# Patient Record
Sex: Female | Born: 1990 | Race: White | Hispanic: No | Marital: Married | Smoking: Never smoker
Health system: Southern US, Community
[De-identification: ages and names within clinical notes are randomized; demographics above are authoritative.]

## PROBLEM LIST (undated history)

## (undated) DIAGNOSIS — F32A Depression, unspecified: Secondary | ICD-10-CM

## (undated) DIAGNOSIS — F419 Anxiety disorder, unspecified: Secondary | ICD-10-CM

## (undated) DIAGNOSIS — R87629 Unspecified abnormal cytological findings in specimens from vagina: Secondary | ICD-10-CM

## (undated) DIAGNOSIS — T7840XA Allergy, unspecified, initial encounter: Secondary | ICD-10-CM

## (undated) HISTORY — PX: TONSILLECTOMY AND ADENOIDECTOMY: SUR1326

## (undated) HISTORY — DX: Allergy, unspecified, initial encounter: T78.40XA

## (undated) HISTORY — PX: WISDOM TOOTH EXTRACTION: SHX21

## (undated) HISTORY — DX: Anxiety disorder, unspecified: F41.9

## (undated) HISTORY — DX: Depression, unspecified: F32.A

## (undated) HISTORY — DX: Unspecified abnormal cytological findings in specimens from vagina: R87.629

---
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
## 2011-03-23 ENCOUNTER — Ambulatory Visit: Payer: Self-pay | Admitting: Family Medicine

## 2011-03-25 ENCOUNTER — Encounter: Payer: Self-pay | Admitting: Family Medicine

## 2011-03-25 ENCOUNTER — Ambulatory Visit (INDEPENDENT_AMBULATORY_CARE_PROVIDER_SITE_OTHER): Payer: BC Managed Care – PPO | Admitting: Family Medicine

## 2011-03-25 VITALS — BP 110/80 | HR 64 | Temp 98.4°F | Ht 67.0 in | Wt 191.0 lb

## 2011-03-25 DIAGNOSIS — Z0000 Encounter for general adult medical examination without abnormal findings: Secondary | ICD-10-CM

## 2011-03-25 DIAGNOSIS — E663 Overweight: Secondary | ICD-10-CM

## 2011-03-25 NOTE — Patient Instructions (Addendum)
Return fasting at your convenience for blood work to check on things. Return as needed or in 2 years for next physical. Good to meet you today!  Call us with questions.

## 2011-03-25 NOTE — Assessment & Plan Note (Addendum)
Healthy 20 yo.  UTD tetanus and guardasil done in past.  Preventative protocols reviewed.  Discussed healthy living. Return fasting for blood work. Return in 2 years for next CPE

## 2011-03-25 NOTE — Assessment & Plan Note (Signed)
BMI 29. Discussed watching diet and increasing exercise as foundation for weight loss. Recommended start incorporating activity into routine. 4 recent steroid courses for sinusitis possibly contributing to recent weight gain.  hopeful not to need any more steroids.

## 2011-03-25 NOTE — Progress Notes (Signed)
  Subjective:    Patient ID: Amy Harvey, female    DOB: [birth year], 20 y.o.   MRN: [id number]  HPI CC: new patient, establish  Sick since December with sinus infection, went through 4 rounds of antibiotics as well as steroids.  T[facility], then went to [place] [facility] and then ENT doctor in [place].  Finished antibiotics and steroids 1 mo ago.  Now feeling better.  Recurrent sinus infections - told allergic to dust mites and grass, and less so to [facility] and white oak.  Noticing since January, gaining lots of weight, wonders if because of steroids.  No change in diet, still exercising same amt.  Has gained 30 lbs.  Activity - has gym at school, plays soccer, frisbee.  Has pool.  As far as diet, doesn't eat much red meat.  Does eat chicken and fruit.  + salads and green beans.  Prepares more than eats out.  Preventative: Tetanus 2010. Last CPE unsure, no recent blood work.  Not fasting today. Well woman exam, none recently.  Previously prescribed birth control by women's physicians in [place] Dr. Vickey Sages.  Scheduled to return there for well woman.  Medications and allergies reviewed and updated as above. Pmhx, Shx, FamHx reviewed and updated in chart.  Review of Systems  Constitutional: Negative for fever, chills, activity change, appetite change, fatigue and unexpected weight change.  HENT: Negative for hearing loss and neck pain.   Eyes: Negative for visual disturbance.  Respiratory: Negative for cough, chest tightness, shortness of breath and wheezing.   Cardiovascular: Negative for chest pain, palpitations and leg swelling.  Gastrointestinal: Negative for nausea, vomiting, abdominal pain, diarrhea, constipation, blood in stool and abdominal distention.  Genitourinary: Negative for hematuria and difficulty urinating.  Musculoskeletal: Negative for myalgias and arthralgias.  Skin: Negative for rash.  Neurological: Negative for dizziness, seizures, syncope and headaches.    Hematological: Does not bruise/bleed easily.  Psychiatric/Behavioral: Negative for dysphoric mood. The patient is not nervous/anxious.       Objective:   Physical Exam  Nursing note and vitals reviewed. Constitutional: She is oriented to person, place, and time. She appears well-developed and well-nourished. No distress.  HENT:  Head: Normocephalic and atraumatic.  Right Ear: External ear normal.  Left Ear: External ear normal.  Nose: Nose normal.  Mouth/Throat: Oropharynx is clear and moist.  Eyes: Conjunctivae and EOM are normal. Pupils are equal, round, and reactive to light.  Neck: Normal range of motion. Neck supple. No thyromegaly present.  Cardiovascular: Normal rate, regular rhythm, normal heart sounds and intact distal pulses.   No murmur heard. Pulses:      Radial pulses are 2+ on the right side, and 2+ on the left side.  Pulmonary/Chest: Effort normal and breath sounds normal. No respiratory distress. She has no wheezes. She has no rales.  Abdominal: Soft. Bowel sounds are normal. She exhibits no distension and no mass. There is no tenderness. There is no rebound and no guarding.  Musculoskeletal: Normal range of motion.  Lymphadenopathy:    She has no cervical adenopathy.  Neurological: She is alert and oriented to person, place, and time.       CN grossly intact, station and gait intact  Skin: Skin is warm and dry. No rash noted.  Psychiatric: She has a normal mood and affect. Her behavior is normal. Judgment and thought content normal.          Assessment & Plan:

## 2012-06-27 ENCOUNTER — Telehealth: Payer: Self-pay | Admitting: *Deleted

## 2012-06-27 ENCOUNTER — Encounter: Payer: Self-pay | Admitting: *Deleted

## 2012-06-27 NOTE — Telephone Encounter (Signed)
Patient needs a letter for her apartment complex listing her allergies. She is having some problems with them and needs the letter to help rectify the situation. Please call when completed.

## 2012-06-27 NOTE — Telephone Encounter (Signed)
plz notify letter ready for pick up.

## 2012-06-28 NOTE — Telephone Encounter (Signed)
Patient notified and letter placed up front for pick up. 

## 2016-07-16 ENCOUNTER — Ambulatory Visit (INDEPENDENT_AMBULATORY_CARE_PROVIDER_SITE_OTHER): Payer: BC Managed Care – PPO | Admitting: Internal Medicine

## 2016-07-16 ENCOUNTER — Encounter: Payer: Self-pay | Admitting: Internal Medicine

## 2016-07-16 ENCOUNTER — Ambulatory Visit: Payer: BC Managed Care – PPO | Admitting: Family Medicine

## 2016-07-16 VITALS — BP 108/68 | HR 57 | Temp 98.3°F | Ht 66.5 in | Wt 183.0 lb

## 2016-07-16 DIAGNOSIS — J069 Acute upper respiratory infection, unspecified: Secondary | ICD-10-CM | POA: Diagnosis not present

## 2016-07-16 DIAGNOSIS — Z91048 Other nonmedicinal substance allergy status: Secondary | ICD-10-CM | POA: Diagnosis not present

## 2016-07-16 DIAGNOSIS — B379 Candidiasis, unspecified: Secondary | ICD-10-CM

## 2016-07-16 DIAGNOSIS — Z9109 Other allergy status, other than to drugs and biological substances: Secondary | ICD-10-CM | POA: Insufficient documentation

## 2016-07-16 DIAGNOSIS — T3695XA Adverse effect of unspecified systemic antibiotic, initial encounter: Secondary | ICD-10-CM

## 2016-07-16 MED ORDER — AZITHROMYCIN 250 MG PO TABS: ORAL_TABLET | ORAL | 0 refills | Status: DC

## 2016-07-16 MED ORDER — FLUCONAZOLE 150 MG PO TABS: 150.0000 mg | ORAL_TABLET | Freq: Once | ORAL | 0 refills | Status: AC

## 2016-07-16 NOTE — Progress Notes (Signed)
Pre visit review using our clinic review tool, if applicable. No additional management support is needed unless otherwise documented below in the visit note.   pt has gone to [place] [facility] since last OV here no other PCP.Marland KitchenMarland KitchenMarland KitchenFlu--never... TD--2010??... Pap--06/2015--[place] [facility]... Vision--yearly... Dentist--PRN.Marland KitchenMarland Kitchen

## 2016-07-16 NOTE — Assessment & Plan Note (Signed)
Advised her to take Zyrtec OTC prn

## 2016-07-16 NOTE — Progress Notes (Signed)
HPI  Pt presents to the clinic today to establish care and for management of the conditions listed below. She is transferring care from Dr. Lynnae Sandhoff, who she has not seen in 5 years.  Environmental Allergies: Worse during the summer. She does not take anything for this OTC.  She also c/o ear fullness, ear pressure, runny nose and cough. This started 1 week ago. She denies ear pain or difficulty hearing. She is blowing clear mucous out of her nose. The cough is productive of green mucous. She has had fever but denies chills or body aches. She has tried Mucinex, Nyquil and Tussin with some relief. She has not had sick contacts that she is aware of.   Flu: never Tetanus: ? 2010 Pap Smear: 06/2015 at [facility] Dentist: as needed  Past Medical History:  Diagnosis Date  . Allergy    grass, dust mites, white oak and hickory    No current outpatient prescriptions on file.   No current facility-administered medications for this visit.     No Known Allergies  Family History  Problem Relation Age of Onset  . Breast cancer Mother 28    not genetic  . Diabetes Father   . Hypertension Father   . Cirrhosis Father   . Vision loss Paternal Grandmother   . Cirrhosis Paternal Grandfather   . Coronary artery disease Neg Hx     Social History   Social History  . Marital status: Single    Spouse name: N/A  . Number of children: N/A  . Years of education: college   Occupational History  . Student Unemployed    [employer], [employer]   Social History Main Topics  . Smoking status: Never Smoker  . Smokeless tobacco: Never Used  . Alcohol use Yes     Comment: Occasional, on weekends  . Drug use: No  . Sexual activity: Yes    Partners: Male     Comment: over summer tested for STIs, no hx of same   Other Topics Concern  . Not on file   Social History Narrative   Caffeine: 1 coke a day.     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   Cat at home.     Lives alone at school.  To get roommate.    ROS:  Constitutional: Pt reports malaise. Denies fever, fatigue, headache or abrupt weight changes.  HEENT: Pt reports ear fullness, runny nose. Denies eye pain, eye redness, ear pain, ringing in the ears, wax buildup, nasal congestion, bloody nose, or sore throat. Respiratory: Pt reports cough. Denies difficulty breathing, shortness of breath.   Cardiovascular: Denies chest pain, chest tightness, palpitations or swelling in the hands or feet.  Gastrointestinal: Denies abdominal pain, bloating, constipation, diarrhea or blood in the stool.  GU: Denies frequency, urgency, pain with urination, blood in urine, odor or discharge. Musculoskeletal: Denies decrease in range of motion, difficulty with gait, muscle pain or joint pain and swelling.  Skin: Denies redness, rashes, lesions or ulcercations.  Neurological: Denies dizziness, difficulty with memory, difficulty with speech or problems with balance and coordination.  Psych: Denies anxiety, depression, SI/HI.  No other specific complaints in a complete review of systems (except as listed in HPI above).  PE: BP 108/68   Pulse (!) 57   Temp 98.3 F (36.8 C) (Oral)   Ht 5' 6.5" (1.689 m)   Wt 183 lb (83 kg)   LMP  (  LMP Unknown) Comment: inserted 06/2015  SpO2 98%   BMI 29.09 kg/m   Wt Readings from Last 3 Encounters:  07/16/16 183 lb (83 kg)  03/25/11 191 lb (86.6 kg) (96 %, Z= 1.80)*   * Growth percentiles are based on CDC 2-20 Years data.    General: Appears her stated age, ill appearing in NAD. HEENT: Head: normal shape and size, no sinus tenderness noted; Eyes: sclera white, no icterus, conjunctiva pink; Ears: Tm's pink but intact, normal light reflex, + serous effusion bilaterally;Throat/Mouth: Teeth present, mucosa pink and moist, no lesions or ulcerations noted.  Neck: No adenopathy noted. Cardiovascular: Normal rate and rhythm. S1,S2  noted.  No murmur, rubs or gallops noted.  Pulmonary/Chest: Normal effort and positive vesicular breath sounds. No respiratory distress. No wheezes, rales or ronchi noted.  Neurological: Alert and oriented.  Psychiatric: Mood and affect normal. Behavior is normal. Judgment and thought content normal.    Assessment and Plan:  URI:  Get some rest and drink plenty of water Start Flonase OTC daily x 1 week eRx for Azithromax x 5 days eRx for Diflucan for antibiotic induced yeast infection Delsym as needed for cough Work note provided  Make an appt for your annual exam Webb Silversmith, NP

## 2016-07-16 NOTE — Patient Instructions (Signed)
Upper Respiratory Infection, Adult Most upper respiratory infections (URIs) are a viral infection of the air passages leading to the lungs. A URI affects the nose, throat, and upper air passages. The most common type of URI is nasopharyngitis and is typically referred to as "the common cold." URIs run their course and usually go away on their own. Most of the time, a URI does not require medical attention, but sometimes a bacterial infection in the upper airways can follow a viral infection. This is called a secondary infection. Sinus and middle ear infections are common types of secondary upper respiratory infections. Bacterial pneumonia can also complicate a URI. A URI can worsen asthma and chronic obstructive pulmonary disease (COPD). Sometimes, these complications can require emergency medical care and may be life threatening.  CAUSES Almost all URIs are caused by viruses. A virus is a type of germ and can spread from one person to another.  RISKS FACTORS You may be at risk for a URI if:   You smoke.   You have chronic heart or lung disease.  You have a weakened defense (immune) system.   You are very young or very old.   You have nasal allergies or asthma.  You work in crowded or poorly ventilated areas.  You work in health care facilities or schools. SIGNS AND SYMPTOMS  Symptoms typically develop 2-3 days after you come in contact with a cold virus. Most viral URIs last 7-10 days. However, viral URIs from the influenza virus (flu virus) can last 14-18 days and are typically more severe. Symptoms may include:   Runny or stuffy (congested) nose.   Sneezing.   Cough.   Sore throat.   Headache.   Fatigue.   Fever.   Loss of appetite.   Pain in your forehead, behind your eyes, and over your cheekbones (sinus pain).  Muscle aches.  DIAGNOSIS  Your health care provider may diagnose a URI by:  Physical exam.  Tests to check that your symptoms are not due to  another condition such as:  Strep throat.  Sinusitis.  Pneumonia.  Asthma. TREATMENT  A URI goes away on its own with time. It cannot be cured with medicines, but medicines may be prescribed or recommended to relieve symptoms. Medicines may help:  Reduce your fever.  Reduce your cough.  Relieve nasal congestion. HOME CARE INSTRUCTIONS   Take medicines only as directed by your health care provider.   Gargle warm saltwater or take cough drops to comfort your throat as directed by your health care provider.  Use a warm mist humidifier or inhale steam from a shower to increase air moisture. This may make it easier to breathe.  Drink enough fluid to keep your urine clear or pale yellow.   Eat soups and other clear broths and maintain good nutrition.   Rest as needed.   Return to work when your temperature has returned to normal or as your health care provider advises. You may need to stay home longer to avoid infecting others. You can also use a face mask and careful hand washing to prevent spread of the virus.  Increase the usage of your inhaler if you have asthma.   Do not use any tobacco products, including cigarettes, chewing tobacco, or electronic cigarettes. If you need help quitting, ask your health care provider. PREVENTION  The best way to protect yourself from getting a cold is to practice good hygiene.   Avoid oral or hand contact with people with cold   symptoms.   Wash your hands often if contact occurs.  There is no clear evidence that vitamin C, vitamin E, echinacea, or exercise reduces the chance of developing a cold. However, it is always recommended to get plenty of rest, exercise, and practice good nutrition.  SEEK MEDICAL CARE IF:   You are getting worse rather than better.   Your symptoms are not controlled by medicine.   You have chills.  You have worsening shortness of breath.  You have brown or red mucus.  You have yellow or brown nasal  discharge.  You have pain in your face, especially when you bend forward.  You have a fever.  You have swollen neck glands.  You have pain while swallowing.  You have white areas in the back of your throat. SEEK IMMEDIATE MEDICAL CARE IF:   You have severe or persistent:  Headache.  Ear pain.  Sinus pain.  Chest pain.  You have chronic lung disease and any of the following:  Wheezing.  Prolonged cough.  Coughing up blood.  A change in your usual mucus.  You have a stiff neck.  You have changes in your:  Vision.  Hearing.  Thinking.  Mood. MAKE SURE YOU:   Understand these instructions.  Will watch your condition.  Will get help right away if you are not doing well or get worse.   This information is not intended to replace advice given to you by your health care provider. Make sure you discuss any questions you have with your health care provider.   Document Released: 04/27/2001 Document Revised: 03/18/2015 Document Reviewed: 02/06/2014 Elsevier Interactive Patient Education 2016 Elsevier Inc.  

## 2018-10-10 ENCOUNTER — Other Ambulatory Visit: Payer: Self-pay | Admitting: Gastroenterology

## 2018-10-10 ENCOUNTER — Ambulatory Visit
Admission: RE | Admit: 2018-10-10 | Discharge: 2018-10-10 | Disposition: A | Discharge status: Home / Self Care | Payer: 59 | Source: Ambulatory Visit | Attending: Gastroenterology | Admitting: Gastroenterology

## 2018-10-10 DIAGNOSIS — R109 Unspecified abdominal pain: Secondary | ICD-10-CM

## 2018-10-10 DIAGNOSIS — K5900 Constipation, unspecified: Secondary | ICD-10-CM

## 2019-05-11 DIAGNOSIS — F419 Anxiety disorder, unspecified: Secondary | ICD-10-CM | POA: Insufficient documentation

## 2019-12-27 IMAGING — CR DG ABDOMEN 2V
2 series · 2 of 2 positions shown · non-contrast
Comparison: None.

CLINICAL DATA: Four days of abdominal pain, decreased bowel
movements. Assess stool burden.

EXAM:
ABDOMEN - 2 VIEW

[w abdomen upright]
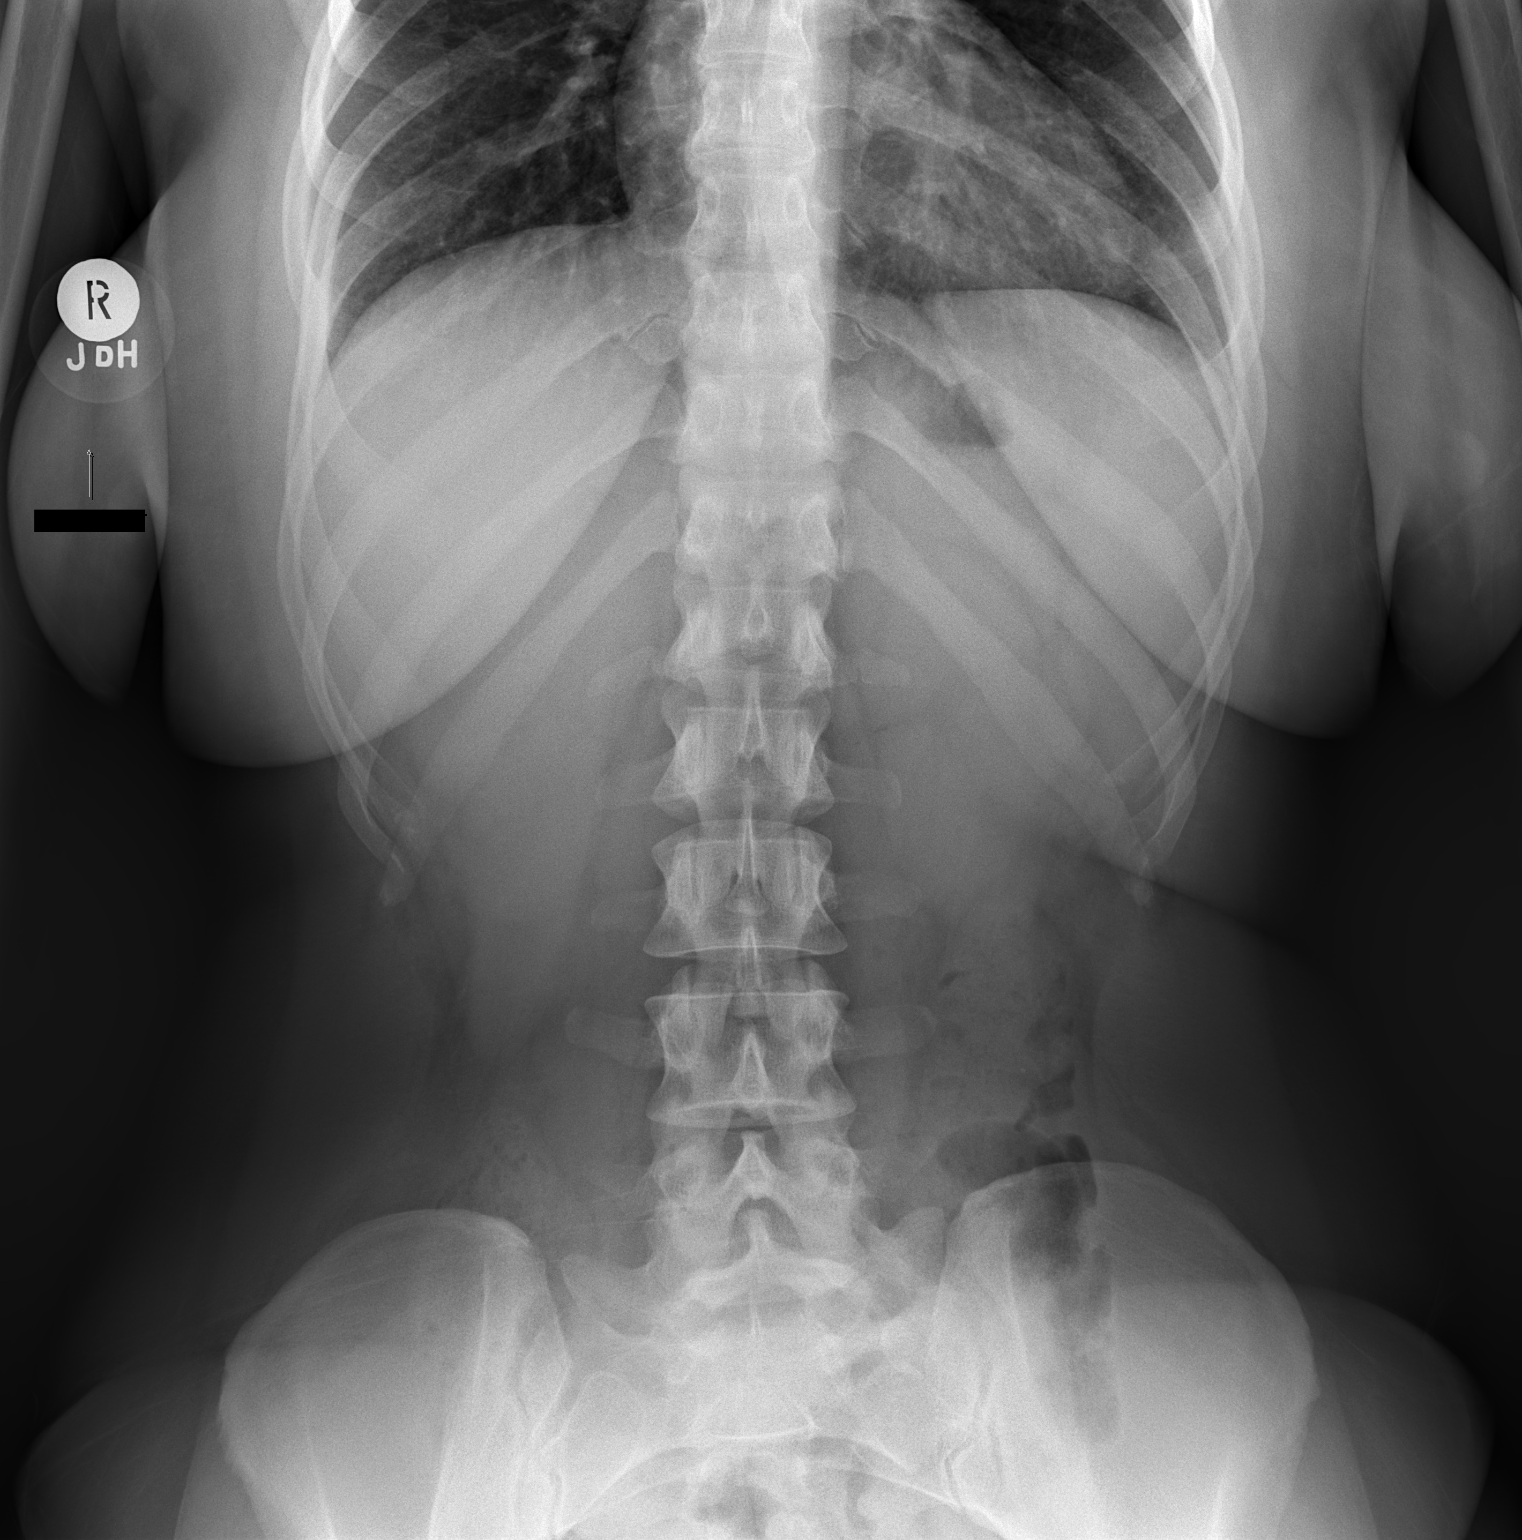

[t abdomen supine]
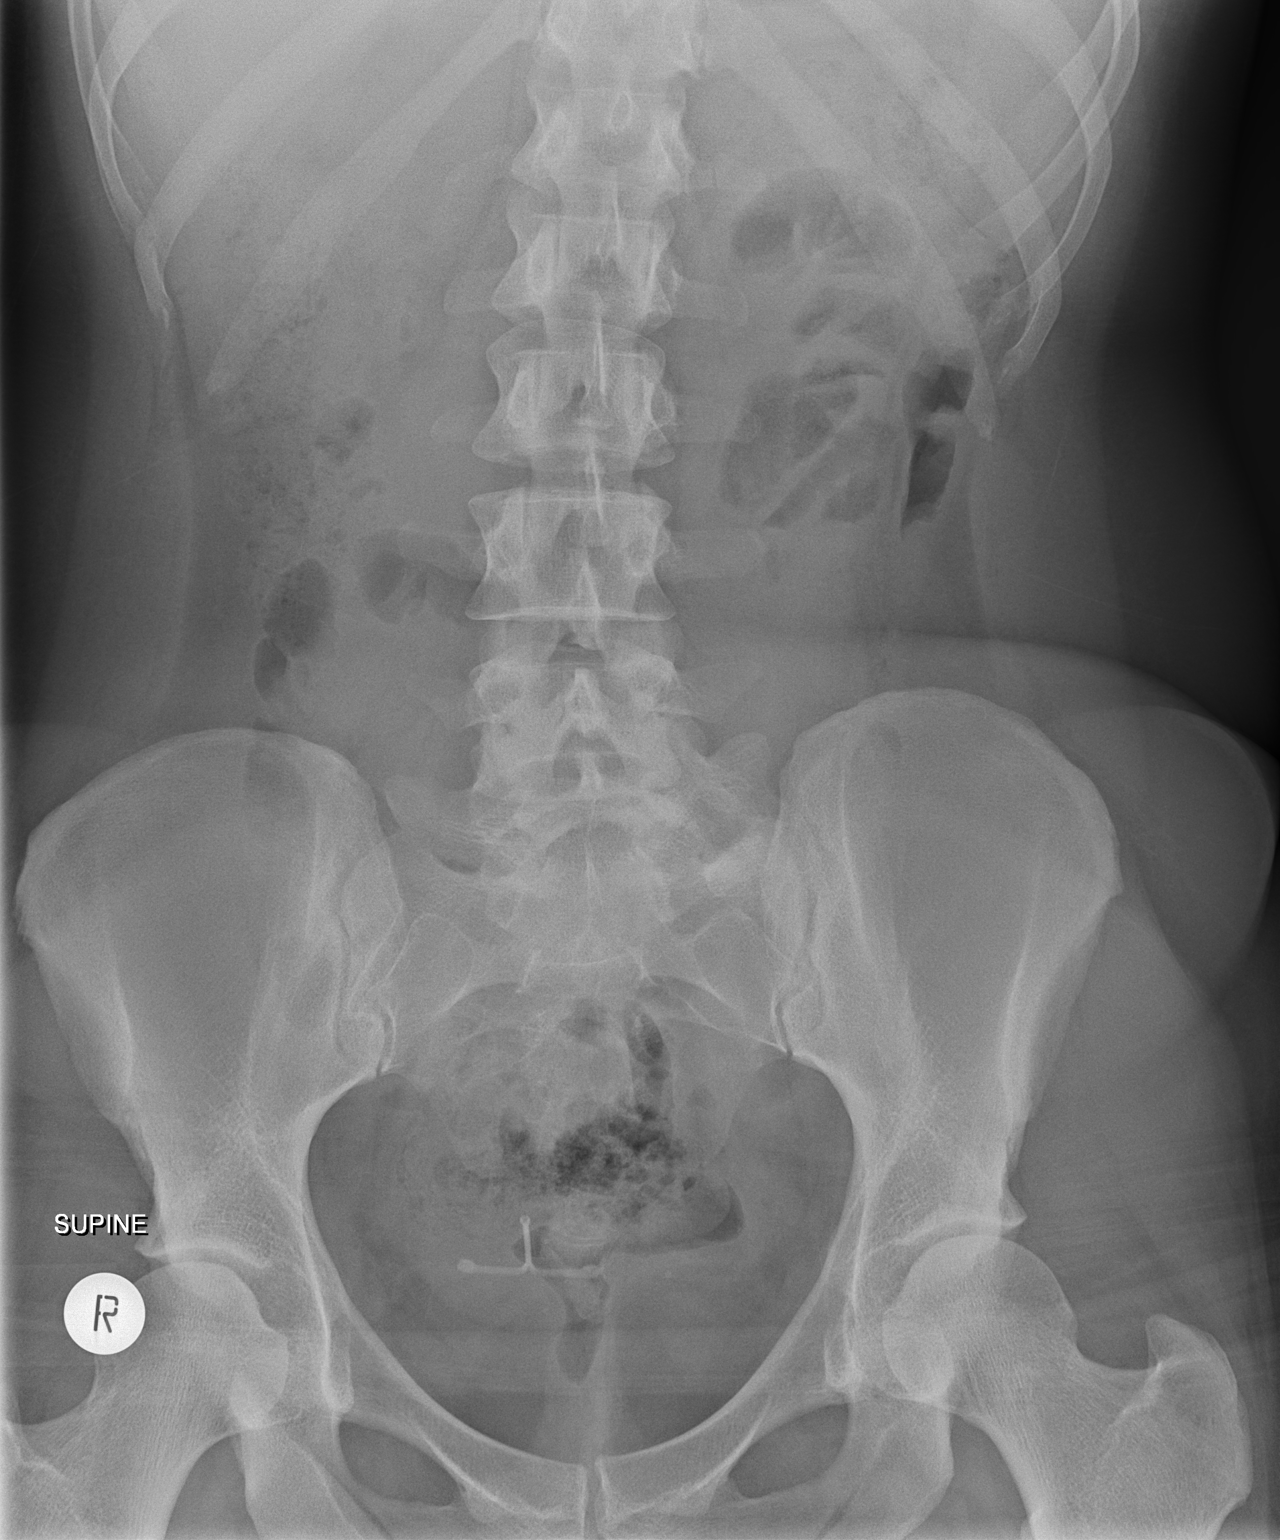

[2 of 2 positions shown; findings below may reference images not displayed]

FINDINGS: The colonic stool burden is moderate. There is no small or large
bowel obstructive pattern. There are no abnormal soft tissue
calcifications. There is an IUD in the pelvis. The bony structures
exhibit no acute abnormalities.
IMPRESSION: Moderate colonic stool burden may reflect constipation in the
appropriate clinical setting. No acute intra-abdominal abnormality.

## 2020-09-11 LAB — RESULTS CONSOLE HPV: CHL HPV: NEGATIVE

## 2020-09-11 LAB — HM PAP SMEAR: HM Pap smear: NEGATIVE

## 2021-02-28 ENCOUNTER — Other Ambulatory Visit: Payer: Self-pay | Admitting: Obstetrics and Gynecology

## 2021-03-16 ENCOUNTER — Other Ambulatory Visit: Payer: Self-pay

## 2021-03-16 ENCOUNTER — Encounter: Payer: Self-pay | Admitting: Dermatology

## 2021-03-16 ENCOUNTER — Ambulatory Visit (INDEPENDENT_AMBULATORY_CARE_PROVIDER_SITE_OTHER): Payer: 59 | Admitting: Dermatology

## 2021-03-16 DIAGNOSIS — Z1283 Encounter for screening for malignant neoplasm of skin: Secondary | ICD-10-CM

## 2021-03-16 DIAGNOSIS — D2222 Melanocytic nevi of left ear and external auricular canal: Secondary | ICD-10-CM | POA: Diagnosis not present

## 2021-03-16 DIAGNOSIS — L578 Other skin changes due to chronic exposure to nonionizing radiation: Secondary | ICD-10-CM

## 2021-03-16 DIAGNOSIS — L814 Other melanin hyperpigmentation: Secondary | ICD-10-CM | POA: Diagnosis not present

## 2021-03-16 DIAGNOSIS — Z86018 Personal history of other benign neoplasm: Secondary | ICD-10-CM

## 2021-03-16 DIAGNOSIS — L821 Other seborrheic keratosis: Secondary | ICD-10-CM

## 2021-03-16 DIAGNOSIS — D485 Neoplasm of uncertain behavior of skin: Secondary | ICD-10-CM

## 2021-03-16 DIAGNOSIS — D1800 Hemangioma unspecified site: Secondary | ICD-10-CM

## 2021-03-16 DIAGNOSIS — D229 Melanocytic nevi, unspecified: Secondary | ICD-10-CM

## 2021-03-16 NOTE — Patient Instructions (Signed)

## 2021-03-16 NOTE — Progress Notes (Signed)
   Follow-Up Visit   Subjective  Amy Harvey is a 30 y.o. female who presents for the following: Annual Exam (History of dysplastic nevi - TBSE today. She is pregnant). The patient presents for Total-Body Skin Exam (TBSE) for skin cancer screening and mole check.  The following portions of the chart were reviewed this encounter and updated as appropriate:   Tobacco  Allergies  Meds  Problems  Med Hx  Surg Hx  Fam Hx     Review of Systems:  No other skin or systemic complaints except as noted in HPI or Assessment and Plan.  Objective  Well appearing patient in no apparent distress; mood and affect are within normal limits.  A full examination was performed including scalp, head, eyes, ears, nose, lips, neck, chest, axillae, abdomen, back, buttocks, bilateral upper extremities, bilateral lower extremities, hands, feet, fingers, toes, fingernails, and toenails. All findings within normal limits unless otherwise noted below.  Objective  Left Ear sup helix: 0.4 cm brown macule   Assessment & Plan    Lentigines - Scattered tan macules - Due to sun exposure - Benign-appering, observe - Recommend daily broad spectrum sunscreen SPF 30+ to sun-exposed areas, reapply every 2 hours as needed. - Call for any changes  Seborrheic Keratoses - Stuck-on, waxy, tan-brown papules and/or plaques  - Benign-appearing - Discussed benign etiology and prognosis. - Observe - Call for any changes  Melanocytic Nevi - Tan-brown and/or pink-flesh-colored symmetric macules and papules - Benign appearing on exam today - Observation - Call clinic for new or changing moles - Recommend daily use of broad spectrum spf 30+ sunscreen to sun-exposed areas.   Hemangiomas - Red papules - Discussed benign nature - Observe - Call for any changes  Actinic Damage - Chronic condition, secondary to cumulative UV/sun exposure - diffuse scaly erythematous macules with underlying dyspigmentation -  Recommend daily broad spectrum sunscreen SPF 30+ to sun-exposed areas, reapply every 2 hours as needed.  - Staying in the shade or wearing long sleeves, sun glasses (UVA+UVB protection) and wide brim hats (4-inch brim around the entire circumference of the hat) are also recommended for sun protection.  - Call for new or changing lesions.  Skin cancer screening performed today.  Neoplasm of uncertain behavior of skin Left Ear sup helix  Skin / nail biopsy Type of biopsy: tangential   Informed consent: discussed and consent obtained   Timeout: patient name, date of birth, surgical site, and procedure verified   Procedure prep:  Patient was prepped and draped in usual sterile fashion Prep type:  Isopropyl alcohol Anesthesia: the lesion was anesthetized in a standard fashion   Anesthetic:  1% lidocaine w/ epinephrine 1-100,000 buffered w/ 8.4% NaHCO3 Instrument used: flexible razor blade   Hemostasis achieved with: pressure, aluminum chloride and electrodesiccation   Outcome: patient tolerated procedure well   Post-procedure details: sterile dressing applied and wound care instructions given   Dressing type: bandage and petrolatum    Specimen 1 - Surgical pathology Differential Diagnosis: Nevus vs dysplastic nevus Check Margins: No 0.4 cm brown macule  Return in about 1 year (around 03/16/2022) for TBSE.   I, Amy Harvey, CMA, am acting as scribe for Sarina Ser, MD .  Documentation: I have reviewed the above documentation for accuracy and completeness, and I agree with the above.  Sarina Ser, MD

## 2021-03-17 ENCOUNTER — Encounter: Payer: Self-pay | Admitting: Dermatology

## 2021-03-25 ENCOUNTER — Telehealth: Payer: Self-pay

## 2021-03-25 NOTE — Telephone Encounter (Signed)
Advised patient of results and scheduled 6 month follow up to recheck biopsy site/hd

## 2021-03-25 NOTE — Telephone Encounter (Signed)
-----   Message from Ralene Bathe, MD sent at 03/25/2021 10:32 AM EDT ----- Diagnosis Skin , left ear superior helix DYSPLASTIC COMPOUND NEVUS WITH SEVERE ATYPIA, CLOSE TO MARGIN, SEE DESCRIPTION  Severe dysplastic Margins free but close Recheck no later than 6 mos.  Make pt 6 mos appt unless she has one for 6 mos May need additional procedure

## 2021-03-26 ENCOUNTER — Other Ambulatory Visit: Payer: Self-pay | Admitting: Obstetrics and Gynecology

## 2021-04-04 ENCOUNTER — Other Ambulatory Visit: Payer: Self-pay | Admitting: Obstetrics and Gynecology

## 2021-04-10 ENCOUNTER — Emergency Department (HOSPITAL_COMMUNITY)
Admission: EM | Admit: 2021-04-10 | Discharge: 2021-04-11 | Disposition: A | Discharge status: Home / Self Care | Payer: 59 | Attending: Emergency Medicine | Admitting: Emergency Medicine

## 2021-04-10 ENCOUNTER — Other Ambulatory Visit: Payer: Self-pay

## 2021-04-10 ENCOUNTER — Encounter (HOSPITAL_COMMUNITY): Payer: Self-pay | Admitting: *Deleted

## 2021-04-10 ENCOUNTER — Emergency Department (HOSPITAL_COMMUNITY): Payer: 59

## 2021-04-10 DIAGNOSIS — R519 Headache, unspecified: Secondary | ICD-10-CM | POA: Insufficient documentation

## 2021-04-10 DIAGNOSIS — R29818 Other symptoms and signs involving the nervous system: Secondary | ICD-10-CM | POA: Diagnosis not present

## 2021-04-10 DIAGNOSIS — Z3A14 14 weeks gestation of pregnancy: Secondary | ICD-10-CM | POA: Diagnosis not present

## 2021-04-10 DIAGNOSIS — R2990 Unspecified symptoms and signs involving the nervous system: Secondary | ICD-10-CM

## 2021-04-10 DIAGNOSIS — O99711 Diseases of the skin and subcutaneous tissue complicating pregnancy, first trimester: Secondary | ICD-10-CM | POA: Insufficient documentation

## 2021-04-10 DIAGNOSIS — R4182 Altered mental status, unspecified: Secondary | ICD-10-CM | POA: Insufficient documentation

## 2021-04-10 DIAGNOSIS — R202 Paresthesia of skin: Secondary | ICD-10-CM

## 2021-04-10 LAB — URINALYSIS, ROUTINE W REFLEX MICROSCOPIC
Bilirubin Urine: NEGATIVE
Glucose, UA: NEGATIVE mg/dL
Hgb urine dipstick: NEGATIVE
Ketones, ur: 20 mg/dL — AB
Leukocytes,Ua: NEGATIVE
Nitrite: NEGATIVE
Protein, ur: NEGATIVE mg/dL
Specific Gravity, Urine: 1.003 — ABNORMAL LOW (ref 1.005–1.030)
pH: 6 (ref 5.0–8.0)

## 2021-04-10 LAB — CBC WITH DIFFERENTIAL/PLATELET
Abs Immature Granulocytes: 0.06 10*3/uL (ref 0.00–0.07)
Basophils Absolute: 0 10*3/uL (ref 0.0–0.1)
Basophils Relative: 0 %
Eosinophils Absolute: 0 10*3/uL (ref 0.0–0.5)
Eosinophils Relative: 0 %
HCT: 40.9 % (ref 36.0–46.0)
Hemoglobin: 14.1 g/dL (ref 12.0–15.0)
Immature Granulocytes: 1 %
Lymphocytes Relative: 18 %
Lymphs Abs: 2.1 10*3/uL (ref 0.7–4.0)
MCH: 30.6 pg (ref 26.0–34.0)
MCHC: 34.5 g/dL (ref 30.0–36.0)
MCV: 88.7 fL (ref 80.0–100.0)
Monocytes Absolute: 0.7 10*3/uL (ref 0.1–1.0)
Monocytes Relative: 6 %
Neutro Abs: 8.7 10*3/uL — ABNORMAL HIGH (ref 1.7–7.7)
Neutrophils Relative %: 75 %
Platelets: 170 10*3/uL (ref 150–400)
RBC: 4.61 MIL/uL (ref 3.87–5.11)
RDW: 12.3 % (ref 11.5–15.5)
WBC: 11.5 10*3/uL — ABNORMAL HIGH (ref 4.0–10.5)
nRBC: 0 % (ref 0.0–0.2)

## 2021-04-10 LAB — BASIC METABOLIC PANEL
Anion gap: 8 (ref 5–15)
BUN: 8 mg/dL (ref 6–20)
CO2: 23 mmol/L (ref 22–32)
Calcium: 9.3 mg/dL (ref 8.9–10.3)
Chloride: 103 mmol/L (ref 98–111)
Creatinine, Ser: 0.63 mg/dL (ref 0.44–1.00)
GFR, Estimated: >60 mL/min (ref >60)
Glucose, Bld: 85 mg/dL (ref 70–99)
Potassium: 3.6 mmol/L (ref 3.5–5.1)
Sodium: 134 mmol/L — ABNORMAL LOW (ref 135–145)

## 2021-04-10 LAB — PROTIME-INR
INR: 0.9 (ref 0.8–1.2)
Prothrombin Time: 12.4 seconds (ref 11.4–15.2)

## 2021-04-10 LAB — APTT: aPTT: 27 seconds (ref 24–36)

## 2021-04-10 LAB — RAPID URINE DRUG SCREEN, HOSP PERFORMED
Amphetamines: NOT DETECTED
Barbiturates: NOT DETECTED
Benzodiazepines: NOT DETECTED
Cocaine: NOT DETECTED
Opiates: NOT DETECTED
Tetrahydrocannabinol: NOT DETECTED

## 2021-04-10 NOTE — ED Provider Notes (Addendum)
Emergency Medicine Provider Triage Evaluation Note  Amy Harvey , a 30 y.o. female  was evaluated in triage.  Pt complains of headache, confusion.  Symptoms started approximately 2 PM.  Had difficulty reading a text message.  She called her husband said she did not make sense.  EMS was called out who said she was may be having a panic attack. No deficets on EMS exam. She denies any weakness.  Symptoms resolved 1.5 hours pTA.Marland Kitchen  She is currently pregnant. No hx of Pre E.  No hypertension, vision changes, abdominal pain  Review of Systems  Positive: HA, paresthesias Negative: Weakness, sudden onset medical  Physical Exam  There were no vitals taken for this visit. Gen:   Awake, no distress   Resp:  Normal effort  MSK:   Moves extremities without difficulty  Neuro:  Cranial nerves II through XII grossly intact   Equal handgrip   Intact sensation   Ambulatory ataxic gait. Other:    Medical Decision Making  Medically screening exam initiated at 3:53 PM.  Appropriate orders placed.  Amy Harvey was informed that the remainder of the evaluation will be completed by another provider, this initial triage assessment does not replace that evaluation, and the importance of remaining in the ED until their evaluation is complete.  Patient with headache, confusion earlier today tingling in her left upper extremity.  She is currently pregnant.  No sudden onset headache.  Symptoms completely resolved.  Patient is not a code stroke.  Discussed with Dr. Ron Parker.  We will plan on labs, imaging   Hailie Searight A, PA-C 04/10/21 1605    Demetres Prochnow A, PA-C 04/10/21 [place], Ankit, MD 04/10/21 1715

## 2021-04-10 NOTE — ED Triage Notes (Signed)
The pt had some episode approx 1400 today  She felt like hshe could not type while she was on the computer  She felt congused lt sided headache since the episode  lmp feb  She is pregnant.  The only symptom at present is the lt sided headache

## 2021-04-11 ENCOUNTER — Other Ambulatory Visit: Payer: Self-pay

## 2021-04-11 MED ORDER — ACETAMINOPHEN 325 MG PO TABS
650.0000 mg | ORAL_TABLET | Freq: Once | ORAL | Status: AC
  Administered 2021-04-11: 650 mg via ORAL
  Filled 2021-04-11: qty 2

## 2021-04-11 NOTE — Discharge Instructions (Signed)
Your work-up in the emergency department was reassuring. We did not find any serious processes related to your symptoms. Attached are some information for possible causes of your symptoms including low blood sugar, migraine headaches, anxiety. Please follow-up with your primary care provider and obstetrician.

## 2021-04-11 NOTE — ED Provider Notes (Signed)
[facility] EMERGENC[facility] DEPARTMENT Provider Note  CSN: [id number] Arrival date & time: 04/10/21 1551  Chief Complaint(s) Altered Mental Status  HPI Amy Harvey is a 30 y.o. female G1 P0 at 14 weeks with a past medical history of anxiety no longer on her Effexor since finding out she was pregnant here for gradual onset of neurologic symptoms including bilateral hand discoordination, left-sided hemibody tingling, blurry vision, difficulty interpreting text, and difficulty with word finding.  Symptom onset was around 1-2p.  Lasted approximately 30 minutes and gradually improved. Also had associated left-sided headache which she attributes to being hungry. She denied any recent fevers or infections.  No coughing or congestion.  No wheezing vomiting or diarrhea. EMS was called and initiated believe the patient was having a panic attack. She had vitals and CBG checked which the patient recalls being normal other than a mildly elevated blood pressure. Patient was brought in by her husband and POV. In route her symptoms completely subsided. Symptoms have not recurred since.  She denies any past history of autoimmune disorder, no illicit drug use.  HPI  Past Medical History Past Medical History:  Diagnosis Date  . Allergy    grass, dust mites, white oak and hickory  . Dysplastic nevus 04/24/2008   R mid med scapula - mild   . Dysplastic nevus 04/24/2008   R upper back 2.5 cm lat to spine - mild   . Dysplastic nevus 03/10/2017   L post shoulder axillary area - mild   . Dysplastic nevus 03/13/2018   R upper back 3.5 cm lat to spine - severe, excision 07/11/2018  . Dysplastic nevus 03/21/2019   R back 3.5 cm lat to spine - mod   . Dysplastic nevus 03/16/2021   left ear sup helix - sup margins fee but close - recheck on follow up 09/2021   Patient Active Problem List   Diagnosis Date Noted  . Environmental allergies 07/16/2016   Home Medication(s) Prior to  Admission medications   Medication Sig Start Date End Date Taking? Authorizing Provider  azithromycin (ZITHROMAX) 250 MG tablet Take 2 tabs today, then 1 tab daily x 4 days 07/16/16   Jearld Fenton, NP                                                                                                                                    Past Surgical History Past Surgical History:  Procedure Laterality Date  . TONSILLECTOMY AND ADENOIDECTOMY     as child  . WISDOM TOOTH EXTRACTION     Family History Family History  Problem Relation Age of Onset  . Breast cancer Mother 79       not genetic  . Diabetes Father   . Hypertension Father   . Cirrhosis Father   . Vision loss Paternal Grandmother   . Cirrhosis Paternal Grandfather   . Coronary artery disease Neg Hx  Social History Social History   Tobacco Use  . Smoking status: Never Smoker  . Smokeless tobacco: Never Used  Substance Use Topics  . Alcohol use: Yes    Comment: rare  . Drug use: No   Allergies Patient has no known allergies.  Review of Systems Review of Systems All other systems are reviewed and are negative for acute change except as noted in the HPI  Physical Exam Vital Signs  I have reviewed the triage vital signs BP 120/81 (BP Location: Right Arm)   Pulse 65   Temp 98.3 F (36.8 C) (Oral)   Resp 16   Ht 5\' 7"  (1.702 m)   Wt 81.6 kg   LMP 01/11/2021   SpO2 100%   BMI 28.19 kg/m   Physical Exam Vitals reviewed.  Constitutional:      General: She is not in acute distress.    Appearance: She is well-developed. She is not diaphoretic.  HENT:     Head: Normocephalic and atraumatic.     Right Ear: External ear normal.     Left Ear: External ear normal.     Nose: Nose normal.  Eyes:     General: No scleral icterus.    Conjunctiva/sclera: Conjunctivae normal.  Neck:     Trachea: Phonation normal.  Cardiovascular:     Rate and Rhythm: Normal rate and regular rhythm.  Pulmonary:     Effort:  Pulmonary effort is normal. No respiratory distress.     Breath sounds: No stridor.  Abdominal:     General: There is no distension.  Musculoskeletal:        General: Normal range of motion.     Cervical back: Normal range of motion.  Neurological:     Mental Status: She is alert and oriented to person, place, and time.     Comments: Mental Status:  Alert and oriented to person, place, and time.  Attention and concentration normal.  Speech clear.  Recent memory is intact  Cranial Nerves:  II Visual Fields: Intact to confrontation. Visual fields intact. III, IV, VI: Pupils equal and reactive to light and near. Full eye movement without nystagmus  V Facial Sensation: Normal. No weakness of masticatory muscles  VII: No facial weakness or asymmetry  VIII Auditory Acuity: Grossly normal  IX/X: The uvula is midline; the palate elevates symmetrically  XI: Normal sternocleidomastoid and trapezius strength  XII: The tongue is midline. No atrophy or fasciculations.   Motor System: Muscle Strength: 5/5 and symmetric in the upper and lower extremities. No pronation or drift.  Muscle Tone: Tone and muscle bulk are normal in the upper and lower extremities.   Reflexes: DTRs: 1+ and symmetrical in all four extremities. No Clonus Coordination: Intact finger-to-nose.  No tremor.  Sensation: Intact to light touch.  Gait: Routine gait normal.   Psychiatric:        Behavior: Behavior normal.     ED Results and Treatments Labs (all labs ordered are listed, but only abnormal results are displayed) Labs Reviewed  CBC WITH DIFFERENTIAL/PLATELET - Abnormal; Notable for the following components:      Result Value   WBC 11.5 (*)    Neutro Abs 8.7 (*)    All other components within normal limits  BASIC METABOLIC PANEL - Abnormal; Notable for the following components:   Sodium 134 (*)    All other components within normal limits  URINALYSIS, ROUTINE W REFLEX MICROSCOPIC - Abnormal; Notable for  the following components:   APPearance  CLOUDY (*)    Specific Gravity, Urine 1.003 (*)    Ketones, ur 20 (*)    All other components within normal limits  PROTIME-INR  APTT  RAPID URINE DRUG SCREEN, HOSP PERFORMED                                                                                                                         EKG  EKG Interpretation  Date/Time:    Ventricular Rate:    PR Interval:    QRS Duration:   QT Interval:    QTC Calculation:   R Axis:     Text Interpretation:        Radiology CT Head Wo Contrast  Result Date: 04/10/2021 CLINICAL DATA:  Transient ischemic attack. 30 minutes period of confusion today. Patient is [redacted] weeks pregnant. EXAM: CT HEAD WITHOUT CONTRAST TECHNIQUE: Contiguous axial images were obtained from the base of the skull through the vertex without intravenous contrast. COMPARISON:  None. FINDINGS: Brain: No intracranial hemorrhage, mass effect, or midline shift. No hydrocephalus. The basilar cisterns are patent. No evidence of territorial infarct or acute ischemia. No extra-axial or intracranial fluid collection. Vascular: No hyperdense vessel or unexpected calcification. Skull: No fracture or focal lesion. Sinuses/Orbits: Paranasal sinuses and mastoid air cells are clear. The visualized orbits are unremarkable. Other: None. IMPRESSION: Negative noncontrast head CT. Electronically Signed   By: Keith Rake M.D.   On: 04/10/2021 17:38    Pertinent labs & imaging results that were available during my care of the patient were reviewed by me and considered in my medical decision making (see chart for details).  Medications Ordered in ED Medications  acetaminophen (TYLENOL) tablet 650 mg (650 mg Oral Given 04/11/21 0048)                                                                                                                                    Procedures Procedures  (including critical care time)  Medical Decision Making / ED  Course I have reviewed the nursing notes for this encounter and the patient's prior records (if available in EHR or on provided paperwork).   Amy Harvey was evaluated in Emergency Department on 04/11/2021 for the symptoms described in the history of present illness. She was evaluated in the context of the global COVID-19 pandemic, which necessitated consideration that the patient might be at risk for infection with the  SARS-CoV-2 virus that causes COVID-19. Institutional protocols and algorithms that pertain to the evaluation of patients at risk for COVID-19 are in a state of rapid change based on information released by regulatory bodies including the CDC and federal and state organizations. These policies and algorithms were followed during the patient's care in the ED.  Patient presents with a cluster of neurologic symptoms. Some of these appear to be bilateral/global. Patient was seen during the MSE process and felt that a code stroke was not warranted. Screening labs and CT head were ordered. Labs are grossly reassuring without significant electrolyte derangements or renal sufficiency.  UA without evidence of infection. CT head negative for any ICH, remote strokes, or mass-effect. Patient has still has a mild left-sided headache but otherwise remained asymptomatic for over 8 hours.  She was provided with Tylenol.  Low suspicion for serious neurologic process including CVA, VST.  At 14 weeks it is unlikely the patient has at risk for preeclampsia/eclampsia.  Other possible etiologies could be episodic hypoglycemia, complex migraine, somatozation from panic attack  Fetal heart tones were rechecked and reassuring.       Final Clinical Impression(s) / ED Diagnoses Final diagnoses:  Paresthesia  Episode of transient neurologic symptoms   The patient appears reasonably screened and/or stabilized for discharge and I doubt any other medical condition or other [facility] requiring further  screening, evaluation, or treatment in the ED at this time prior to discharge. Safe for discharge with strict return precautions.  Disposition: Discharge  Condition: Good  I have discussed the results, Dx and Tx plan with the patient/family who expressed understanding and agree(s) with the plan. Discharge instructions discussed at length. The patient/family was given strict return precautions who verbalized understanding of the instructions. No further questions at time of discharge.    ED Discharge Orders    None     Follow Up: Tyson Dense, MD [place] [address] [phone]  Call  to schedule an appointment for close follow up      This chart was dictated using voice recognition software.  Despite best efforts to proofread,  errors can occur which can change the documentation meaning.   Fatima Blank, MD 04/11/21 [phone]

## 2021-05-22 ENCOUNTER — Other Ambulatory Visit: Payer: Self-pay | Admitting: Obstetrics and Gynecology

## 2021-05-25 ENCOUNTER — Other Ambulatory Visit: Payer: Self-pay | Admitting: Obstetrics and Gynecology

## 2021-05-25 DIAGNOSIS — Z363 Encounter for antenatal screening for malformations: Secondary | ICD-10-CM

## 2021-05-28 ENCOUNTER — Ambulatory Visit: Payer: 59 | Attending: Obstetrics and Gynecology

## 2021-05-28 ENCOUNTER — Ambulatory Visit: Payer: 59 | Attending: Obstetrics and Gynecology | Admitting: Obstetrics and Gynecology

## 2021-05-28 ENCOUNTER — Other Ambulatory Visit: Payer: Self-pay

## 2021-05-28 ENCOUNTER — Ambulatory Visit: Payer: 59 | Admitting: *Deleted

## 2021-05-28 ENCOUNTER — Encounter: Payer: Self-pay | Admitting: *Deleted

## 2021-05-28 VITALS — BP 112/66 | HR 60

## 2021-05-28 DIAGNOSIS — R772 Abnormality of alphafetoprotein: Secondary | ICD-10-CM | POA: Diagnosis present

## 2021-05-28 DIAGNOSIS — O43192 Other malformation of placenta, second trimester: Secondary | ICD-10-CM | POA: Diagnosis not present

## 2021-05-28 DIAGNOSIS — Z3A21 21 weeks gestation of pregnancy: Secondary | ICD-10-CM | POA: Diagnosis not present

## 2021-05-28 DIAGNOSIS — O283 Abnormal ultrasonic finding on antenatal screening of mother: Secondary | ICD-10-CM

## 2021-05-28 DIAGNOSIS — Z361 Encounter for antenatal screening for raised alphafetoprotein level: Secondary | ICD-10-CM

## 2021-05-28 DIAGNOSIS — Z363 Encounter for antenatal screening for malformations: Secondary | ICD-10-CM | POA: Diagnosis present

## 2021-05-28 NOTE — Progress Notes (Signed)
Maternal-Fetal Medicine   Name: Amy Harvey DOB: December 16, 1990 MRN: [id number] Referring Provider: Lucillie Garfinkel, MD   I had the pleasure of [name] [facility]  She is here for a second opinion fetal anatomy scan.  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  MSAFP screening showed increased risk for open neural tube defect (OSBR 1 and 238; AFP 2.6 MoM).  Patient does not give history of vaginal bleeding in this pregnancy.  On cell free fetal DNA screening, she had low risk for fetal aneuploidies. Her prenatal course has otherwise been uneventful.  Ultrasound We performed fetal anatomy scan.  An echogenic intracardiac focus is seen.  No other markers of aneuploidies or fetal structural defects are seen.  Intracranial structures and fetal spine appear normal.  There is no evidence of spina bifid.  Abdominal wall could not be assessed clearly but there is no evidence of omphalocele or gastroschisis. 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  Our concerns include Increased risk for open-neural tube defects and increased AFP I reassured the patient of normal fetal anatomy on ultrasound including spine and intracranial structures. I informed her that ultrasound can detect up to 95 out of 100 cases of spina bifida and that amniocentesis will only marginally improve the detection rate. Increased AFP can also be associated with fetal growth restriction (placental insufficiency), preterm delivery. I recommended serial fetal growth assessments. Vaginal bleeding can lead to increased MSAFP.  Echogenic intracardiac focus Echogenic intracardiac focus is seen in about 3% of normal fetuses (more in Asian population), and in about 15%-20% of fetuses with Down syndrome. She was reassured that echogenic focus is not associated with any structural heart malformations.  Given that she had low risk for Down syndrome on cell free fetal DNA screening, this finding  should be considered a normal variant and not a marker for Down syndrome. I explained that only amniocentesis will give a definitive result on the fetal karyotype.  I discussed the procedure and possible complication of miscarriage (1 and 500 procedures).  Patient opted not to have amniocentesis.   Marginal cord insertion I explained the finding with help of diagrams.  Marginal cord insertion is usually associated with good fetal outcomes.  In some cases, it can be associated with fetal growth restriction.  We recommend serial fetal growth assessments.  Thank you for consultation.  If you have any questions or concerns, please contact me the [facility].  Consultation including face-to-face (more than 50%) counseling 30 minutes.

## 2021-05-29 ENCOUNTER — Other Ambulatory Visit: Payer: Self-pay | Admitting: *Deleted

## 2021-05-29 DIAGNOSIS — R772 Abnormality of alphafetoprotein: Secondary | ICD-10-CM

## 2021-06-02 ENCOUNTER — Other Ambulatory Visit: Payer: Self-pay

## 2021-06-29 ENCOUNTER — Ambulatory Visit: Payer: 59 | Admitting: *Deleted

## 2021-06-29 ENCOUNTER — Other Ambulatory Visit: Payer: Self-pay

## 2021-06-29 ENCOUNTER — Other Ambulatory Visit: Payer: Self-pay | Admitting: Obstetrics and Gynecology

## 2021-06-29 ENCOUNTER — Ambulatory Visit: Payer: 59 | Attending: Obstetrics and Gynecology

## 2021-06-29 ENCOUNTER — Ambulatory Visit (HOSPITAL_BASED_OUTPATIENT_CLINIC_OR_DEPARTMENT_OTHER): Payer: 59 | Admitting: Maternal & Fetal Medicine

## 2021-06-29 VITALS — BP 114/70 | HR 54

## 2021-06-29 DIAGNOSIS — O43192 Other malformation of placenta, second trimester: Secondary | ICD-10-CM | POA: Diagnosis not present

## 2021-06-29 DIAGNOSIS — R772 Abnormality of alphafetoprotein: Secondary | ICD-10-CM

## 2021-06-29 DIAGNOSIS — Z3A25 25 weeks gestation of pregnancy: Secondary | ICD-10-CM

## 2021-06-29 DIAGNOSIS — O358XX0 Maternal care for other (suspected) fetal abnormality and damage, not applicable or unspecified: Secondary | ICD-10-CM

## 2021-06-29 DIAGNOSIS — O43199 Other malformation of placenta, unspecified trimester: Secondary | ICD-10-CM | POA: Diagnosis present

## 2021-06-29 DIAGNOSIS — Z361 Encounter for antenatal screening for raised alphafetoprotein level: Secondary | ICD-10-CM | POA: Diagnosis not present

## 2021-06-29 DIAGNOSIS — Z364 Encounter for antenatal screening for fetal growth retardation: Secondary | ICD-10-CM | POA: Insufficient documentation

## 2021-06-29 DIAGNOSIS — Z363 Encounter for antenatal screening for malformations: Secondary | ICD-10-CM

## 2021-06-30 ENCOUNTER — Other Ambulatory Visit: Payer: Self-pay | Admitting: *Deleted

## 2021-06-30 DIAGNOSIS — R772 Abnormality of alphafetoprotein: Secondary | ICD-10-CM

## 2021-06-30 DIAGNOSIS — O43199 Other malformation of placenta, unspecified trimester: Secondary | ICD-10-CM

## 2021-06-30 DIAGNOSIS — O365990 Maternal care for other known or suspected poor fetal growth, unspecified trimester, not applicable or unspecified: Secondary | ICD-10-CM

## 2021-06-30 NOTE — Progress Notes (Signed)
MFM Consultation  Single intrauterine pregnancy here for a follow up growth due to small for gestational age with an elevated AFP and EIF. Normal anatomy with measurements less than dates due to IUGR EFW 1.3% There is good fetal movement and amniotic fluid volume The UA Dopplers are normal without evidence of AEDF or REDF.  Previously Ms. Mitchum declined diagnostic testing. She declined again today given today's findings of FGR.   I discussed today's visit with a diagnosis of FGR. I explained that the etiology includes placental insufficiency, chronic disease, infection, aneuploidy and other genetic syndromes. She has a low risk NIPS and negative horizon as no additional risk factors for chronic disease. At this time I explained the diagnosis, evaluation and management to include on going fetal growth and weekly antenatal testing to include UA Dopplers.   In addition, the bowel appeared prominent but not bright as bone. I discussed possible causes including infection and genetic syndromes. She opted to have TORCH titers drawn but will return later this week given our lab techs had left for the day.  If the EFW < 3rd% persist or abnormal testing occurs, I recommend delivery at 37 weeks otherwise if all is normal consider delivery at 39 weeks.   Recommendations: Follow up growth in 4 weeks Repeat UA Dopplers in 2 weeks.  TORCH titers.  I spent 30 minutes with >50% in face to face consultation.  All questions answered.  Vikki Ports, MD

## 2021-07-03 ENCOUNTER — Telehealth: Payer: Self-pay | Admitting: Obstetrics and Gynecology

## 2021-07-03 NOTE — Telephone Encounter (Signed)
Yes, that is fine. 

## 2021-07-03 NOTE — Telephone Encounter (Signed)
Mr. Almaras called in wanted to know if Dr.Cody would take his wife as a patient and wanted to know if they take her baby as a patient

## 2021-07-07 NOTE — Telephone Encounter (Signed)
Called Mrs. Baby and got her scheduled for 9/14 @ 8:40

## 2021-07-13 ENCOUNTER — Ambulatory Visit (HOSPITAL_BASED_OUTPATIENT_CLINIC_OR_DEPARTMENT_OTHER): Payer: 59 | Admitting: *Deleted

## 2021-07-13 ENCOUNTER — Encounter: Payer: Self-pay | Admitting: *Deleted

## 2021-07-13 ENCOUNTER — Ambulatory Visit: Payer: 59 | Attending: Maternal & Fetal Medicine

## 2021-07-13 ENCOUNTER — Ambulatory Visit: Payer: 59

## 2021-07-13 ENCOUNTER — Ambulatory Visit: Payer: 59 | Admitting: *Deleted

## 2021-07-13 ENCOUNTER — Other Ambulatory Visit: Payer: Self-pay

## 2021-07-13 VITALS — BP 124/80 | HR 45

## 2021-07-13 DIAGNOSIS — O43192 Other malformation of placenta, second trimester: Secondary | ICD-10-CM

## 2021-07-13 DIAGNOSIS — O365920 Maternal care for other known or suspected poor fetal growth, second trimester, not applicable or unspecified: Secondary | ICD-10-CM

## 2021-07-13 DIAGNOSIS — Z3A27 27 weeks gestation of pregnancy: Secondary | ICD-10-CM | POA: Diagnosis present

## 2021-07-13 DIAGNOSIS — O283 Abnormal ultrasonic finding on antenatal screening of mother: Secondary | ICD-10-CM | POA: Diagnosis present

## 2021-07-13 DIAGNOSIS — O358XX0 Maternal care for other (suspected) fetal abnormality and damage, not applicable or unspecified: Secondary | ICD-10-CM

## 2021-07-13 DIAGNOSIS — O43199 Other malformation of placenta, unspecified trimester: Secondary | ICD-10-CM | POA: Insufficient documentation

## 2021-07-13 DIAGNOSIS — Z362 Encounter for other antenatal screening follow-up: Secondary | ICD-10-CM | POA: Diagnosis not present

## 2021-07-13 DIAGNOSIS — R772 Abnormality of alphafetoprotein: Secondary | ICD-10-CM | POA: Insufficient documentation

## 2021-07-13 DIAGNOSIS — Z3A26 26 weeks gestation of pregnancy: Secondary | ICD-10-CM

## 2021-07-13 DIAGNOSIS — O365990 Maternal care for other known or suspected poor fetal growth, unspecified trimester, not applicable or unspecified: Secondary | ICD-10-CM | POA: Diagnosis not present

## 2021-07-13 NOTE — Procedures (Signed)
Amy Harvey [place] 1991/07/08 [redacted]w[redacted]d Fetus A Non-Stress Test Interpretation for 07/13/21  Indication: IUGR  Fetal Heart Rate A Mode: External Baseline Rate (A): 130 bpm Variability: Moderate Accelerations: 15 x 15 Decelerations: None Multiple birth?: No  Uterine Activity Mode: Palpation, Toco Contraction Frequency (min): none Resting Tone Palpated: Relaxed  Interpretation (Fetal Testing) Nonstress Test Interpretation: Reactive Overall Impression: Reassuring for gestational age Comments: Dr. SDonalee Citrinreviewed tracing.

## 2021-07-14 ENCOUNTER — Other Ambulatory Visit: Payer: Self-pay | Admitting: *Deleted

## 2021-07-14 DIAGNOSIS — O365930 Maternal care for other known or suspected poor fetal growth, third trimester, not applicable or unspecified: Secondary | ICD-10-CM

## 2021-07-14 LAB — TOXOPLASMA GONDII ANTIBODY, IGG: Toxoplasma IgG Ratio: <3 IU/mL (ref 0.0–7.1)

## 2021-07-14 LAB — CMV ANTIBODY, IGG (EIA): CMV Ab - IgG: 5.5 U/mL — ABNORMAL HIGH (ref 0.00–0.59)

## 2021-07-14 LAB — TOXOPLASMA GONDII ANTIBODY, IGM: Toxoplasma Antibody- IgM: <3 AU/mL (ref 0.0–7.9)

## 2021-07-14 LAB — INFECT DISEASE AB IGM REFLEX 1

## 2021-07-14 LAB — CMV IGM: CMV IgM Ser EIA-aCnc: <30 AU/mL (ref 0.0–29.9)

## 2021-07-15 ENCOUNTER — Encounter: Payer: Self-pay | Admitting: *Deleted

## 2021-07-16 ENCOUNTER — Ambulatory Visit (INDEPENDENT_AMBULATORY_CARE_PROVIDER_SITE_OTHER): Payer: 59 | Admitting: Neurology

## 2021-07-16 ENCOUNTER — Encounter: Payer: Self-pay | Admitting: Neurology

## 2021-07-16 VITALS — BP 153/99 | HR 50 | Ht 67.0 in | Wt 197.8 lb

## 2021-07-16 DIAGNOSIS — G43109 Migraine with aura, not intractable, without status migrainosus: Secondary | ICD-10-CM | POA: Diagnosis not present

## 2021-07-16 DIAGNOSIS — R001 Bradycardia, unspecified: Secondary | ICD-10-CM | POA: Diagnosis not present

## 2021-07-16 NOTE — Progress Notes (Signed)
Subjective:    Patient ID: Amy Harvey is a 30 y.o. female.  HPI    Amy Age, MD, PhD [facility] [address] [place], [place] [address]  Dear Dr. Royston Sinner,   I saw your patient, Amy Harvey, upon your kind request, in my neurologic clinic today for initial consultation of her migraines.  The patient is unaccompanied today.  As you know, Amy Harvey is a 30 year old right-handed woman with an underlying medical history of allergies, anxiety, currently pregnant in the third trimester, who reports having had intermittent visual auras with intermittent headaches since her pregnancy.  She had a more significant symptomatology in May which prompted her to go to the emergency room.  She reports that she had difficulty finding her words, she felt confused, she had tingling, she had a headache on one side, she also had blurry vision.  She has had floaters since then but not necessarily always a headache.  She does not typically have a history of migraines.  She has not had any similar presentation since then.  She reports that she has had a difficult pregnancy thus far and there are problems with the baby's growth.  She has had stress, she also reports that when she found out that she was pregnant in mid February she stopped her Effexor abruptly.  She was on long-acting Effexor 150 mg daily at the time and stopped it immediately without tapering off.  She admits that she should have probably have tapered it and she was even trying to get in touch with her mental health provider but could not get a hold of them.  She also immediately stopped her caffeine intake and she was drinking more than 1 cup of coffee daily at the time.  She has since then resumed limited caffeine intake by drinking 1 cup/day.  She reports that she has been on antidepressant medication since 2020 when she suddenly lost her father at the Harvey of 64 from a sudden heart attack.  She  reports that she was very close to him and that hit her heart.  She suffered from severe anxiety and panic attacks at the time and the medication helped.  She also lost her job around that time due to the pandemic.  She denies any sudden onset of one-sided weakness or numbness or tingling or droopy face or slurring of speech otherwise.  She has regular eye examinations.  She has a history of bradycardia, today she had a very low heart rate at 42, rechecked twice.  Later on it increased to 50/min. She reports that she has seen a cardiologist in 2020 or 2021 before. I reviewed your office note from 30/27/2022..  She presented to the emergency room on 04/10/2021 with headache and confusion, as well as blurry vision and tingling.  I reviewed the emergency room records.  She had a head CT without contrast on 04/10/2021 and I reviewed the results: IMPRESSION: Negative noncontrast head CT.  She was treated symptomatically with Tylenol.  Laboratory work-up showed negative UDS, urinalysis with ketones, BMP with sodium of 134, otherwise benign, CBC with differential showed a white cell count of 11.5, otherwise benign.  She is currently not on any prescription medication. She lives with her husband, she is self-employed.  She has no other children, she is a non-smoker and currently does not drink any alcohol.  Her Past Medical History Is Significant For: Past Medical History:  Diagnosis Date   Allergy  grass, dust mites, white oak and hickory   Anxiety    Anxiety    Depression    Dysplastic nevus 04/24/2008   R mid med scapula - mild    Dysplastic nevus 04/24/2008   R upper back 2.5 cm lat to spine - mild    Dysplastic nevus 03/10/2017   L post shoulder axillary area - mild    Dysplastic nevus 03/13/2018   R upper back 3.5 cm lat to spine - severe, excision 07/11/2018   Dysplastic nevus 03/21/2019   R back 3.5 cm lat to spine - mod    Dysplastic nevus 03/16/2021   left ear sup helix - sup margins fee  but close - recheck on follow up 09/2021   Vaginal Pap smear, abnormal     Her Past Surgical History Is Significant For: Past Surgical History:  Procedure Laterality Date   TONSILLECTOMY AND ADENOIDECTOMY     as child   WISDOM TOOTH EXTRACTION      Her Family History Is Significant For: Family History  Problem Relation Harvey of Onset   Breast cancer Mother 37       not genetic   Coronary artery disease Father    Diabetes Father    Hypertension Father    Cirrhosis Father    Heart attack Father    Alzheimer's disease Maternal Grandmother    Stroke Paternal Grandmother    Vision loss Paternal Grandmother    Coronary artery disease Paternal Grandfather    Diabetes Paternal Grandfather    Cirrhosis Paternal Grandfather     Her Social History Is Significant For: Social History   Socioeconomic History   Marital status: Single    Spouse name: Not on file   Number of children: Not on file   Years of education: college   Highest education level: Not on file  Occupational History   Occupation: Lexicographer: unemployed    Comment: [employer], psycholog  Tobacco Use   Smoking status: Never   Smokeless tobacco: Never  Vaping Use   Vaping Use: Never used  Substance and Sexual Activity   Alcohol use: Yes    Comment: rare   Drug use: No   Sexual activity: Yes    Partners: Male    Comment: over summer tested for STIs, no hx of same  Other Topics Concern   Not on file  Social History Narrative   Caffeine: 1 coffee a day.  Junior at [employer] wants to get master's degree in counseling psychology.Cat at home.  Lives alone at school.  To get roommate.   Social Determinants of Health   Financial Resource Strain: Not on file  Food Insecurity: Not on file  Transportation Needs: Not on file  Physical Activity: Not on file  Stress: Not on file  Social Connections: Not on file    Her Allergies Are:  No Known Allergies:   Her Current Medications Are:   Outpatient Encounter Medications as of 07/16/2021  Medication Sig   doxylamine, Sleep, (UNISOM) 25 MG tablet Unisom (doxylamine)  Vitamin B6   Prenatal Vit-Fe Fumarate-FA (PRENATAL MULTIVITAMIN) TABS tablet Take 1 tablet by mouth daily at 12 noon.   Pyridoxine HCl (B-6 PO) Take by mouth daily.   [DISCONTINUED] azithromycin (ZITHROMAX) 250 MG tablet Take 2 tabs today, then 1 tab daily x 4 days (Patient not taking: No sig reported)   [DISCONTINUED] levonorgestrel (MIRENA) 20 MCG/DAY IUD by Intrauterine route.   [DISCONTINUED] venlafaxine XR (EFFEXOR-XR) 150 MG  24 hr capsule Take 150 mg by mouth daily.   No facility-administered encounter medications on file as of 07/16/2021.  :   Review of Systems:  Out of a complete 14 point review of systems, all are reviewed and negative with the exception of these symptoms as listed below:  Review of Systems  Neurological:        ED visit, stroke like sx, blurred vision, numbness l side body, slurred speech, confusion, followed by headache next day. [redacted] wks pregnant.   Objective:  Neurological Exam  Physical Exam Physical Examination:   Vitals:   07/16/21 0752  BP: 134/88  Pulse: (!) 42    General Examination: The patient is a very pleasant 30 y.o. female in no acute distress. She appears well-developed and well-nourished and well groomed.   HEENT: Normocephalic, atraumatic, pupils are equal, round and reactive to light and accommodation. Funduscopic exam is normal with sharp disc margins noted. Extraocular tracking is good without limitation to gaze excursion or nystagmus noted.  She has mild left exotropia.  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  Normal smooth pursuit is noted. Hearing is grossly intact. Face is symmetric with normal facial animation and normal facial sensation. Speech is clear with no dysarthria noted. There is no hypophonia. There is no lip, neck/head, jaw or voice tremor. Neck is supple with full range  of passive and active motion. There are no carotid bruits on auscultation. Oropharynx exam reveals: mild mouth dryness, good dental hygiene. Tongue protrudes centrally and palate elevates symmetrically.  Chest: Clear to auscultation without wheezing, rhonchi or crackles noted.  Heart: S1+S2+0, regular and normal without murmurs, rubs or gallops noted.   Abdomen: Soft, non-tender and non-distended with normal bowel sounds appreciated on auscultation.  Extremities: There is no pitting edema in the distal lower extremities bilaterally. Pedal pulses are intact.  Skin: Warm and dry without trophic changes noted.  Musculoskeletal: exam reveals no obvious joint deformities.   Neurologically:  Mental status: The patient is awake, alert and oriented in all 4 spheres. Her immediate and remote memory, attention, language skills and fund of knowledge are appropriate. There is no evidence of aphasia, agnosia, apraxia or anomia. Speech is clear with normal prosody and enunciation. Thought process is linear. Mood is normal and affect is normal.  Cranial nerves II - XII are as described above under HEENT exam. In addition: shoulder shrug is normal with equal shoulder height noted. Motor exam: Normal bulk, strength and tone is noted. There is no drift, tremor or rebound. Romberg is negative. Reflexes are 2+ throughout. Babinski: Toes are flexor bilaterally. Fine motor skills and coordination: intact with normal finger taps, normal hand movements, normal rapid alternating patting, normal foot taps and normal foot agility.  Cerebellar testing: No dysmetria or intention tremor on finger to nose testing. Heel to shin is unremarkable bilaterally. There is no truncal or gait ataxia.  Sensory exam: intact to light touch, vibration, temperature sense in the upper and lower extremities.  Gait, station and balance: She stands easily. No veering to one side is noted. No leaning to one side is noted. Posture is  Harvey-appropriate and stance is narrow based. Gait shows normal stride length and normal pace. No problems turning are noted. Tandem walk is unremarkable.      Assessment and Plan:   In summary, Amy Harvey is a very pleasant 30 y.o.-year old female with an underlying medical history of allergies, anxiety, currently pregnant  in the third trimester, who presents for evaluation of her headaches.  She had a recent presentation to the emergency room with several different concerns at the time including cognitive difficulty, confusion, paresthesias, headache, and visual disturbance.  She is currently at baseline.  She has had intermittent visual disturbances including floaters and visual aura in keeping with migraine aura but not necessarily headaches.  Her presentation in May 2022 is in keeping with a complex migraine.  Fortunately, she has not had a similar presentation before or after.  Interestingly, she had just recently discontinued her Effexor quite abruptly and is advised that certain antidepressant medications can cause significant repercussions when stopped abruptly.  Symptoms can include increase in headaches, paresthesias including pain as well as exacerbation of underlying anxiety and depression of course.  She had also recently abruptly stopped her caffeine intake when she found out she was pregnant.  This could have created a perfect storm for her to present with a complex migraine.  Thankfully, she has not had any abnormalities on her recent head CT.  She has a nonfocal examination and is largely reassured today.  Nevertheless, she is advised to keep close follow-up with you and use Tylenol as needed for migraine or other headaches.  She is noted to be quite bradycardic today but is without symptoms and reports that she has had bradycardia for quite some time.  She is advised to follow through with all recommendations through your office and check in with you regarding her rather low heart rate  in the lower 40s today.  She has had an updated eye examination.  We talked at length today.  We talked about common migraine triggers.  We talked about supportive treatment for now, in particular, she is advised to stay well-hydrated, well rested and work on stress reduction as best as possible.  We mutually agreed not to try any new prescription medications as she is pregnant.  We can certainly follow her on an as-needed basis and plan to follow-up once she has had her baby and when she has stopped breast-feeding as most medications are not deemed completely safe for lactating mothers either.  She is agreeable to this approach.  She is advised to follow-up in this clinic on an as-needed basis.  I answered all her questions today and she was in agreement.  This was an extended visit of over 1 hour.  Thank you very much for allowing me to participate in the care of this nice patient. If I can be of any further assistance to you please do not hesitate to call me at [phone].  Sincerely,   Amy Age, MD, PhD

## 2021-07-16 NOTE — Patient Instructions (Signed)
It was nice to meet you today.  Based on your history, your presentation to the emergency room in May 2022 and recent changes in your caffeine intake and medication regimen, I believe you have had an episode of complicated migraine, which prompted you to be evaluated in the emergency room on 04/10/2021.  Thankfully, your CT scan was benign and your neurological exam is benign now.  As discussed, I would like to avoid any prescription migraine medication in your case as you are pregnant.  Stopping Effexor especially at the dose you were on can cause repercussions not just mood wise but also symptoms such as tingling, widespread pain, and headaches.  Suddenly stopping caffeine can also cause so-called caffeine withdrawal headaches.  Please remember, common headache triggers are: sleep deprivation, dehydration, overheating, stress, hypoglycemia or skipping meals and blood sugar fluctuations, excessive pain medications or excessive alcohol use or caffeine withdrawal. Some people have food triggers such as aged cheese, orange juice or chocolate, especially dark chocolate, or MSG (monosodium glutamate). Try to avoid these headache triggers as much possible. It may be helpful to keep a headache diary to figure out what makes your headaches worse or brings them on and what alleviates them. Some people report headache onset after exercise but studies have shown that regular exercise may actually prevent headaches from coming. If you have exercise-induced headaches, please make sure that you drink plenty of fluid before and after exercising and that you do not over do it and do not overheat.  For migraine prevention, we can consider a prescription medicine down the road after you complete your pregnancy and you no longer breast-feed.   We can certainly consider a brain MRI in the future as well.  For now, your exam is nonfocal and reassuring and you already had a CT scan not too long ago.    When you have a  headache, it is probably safe for you to take Tylenol, please check with Dr. Royston Sinner, she may have some other medication that she would be comfortable for you to use.    Please double check with your OB since you have significant bradycardia today, even though you do not have any symptoms from it.   As explained, I would be happy to see you back as needed.  I wish you all the best with your pregnancy.

## 2021-07-21 ENCOUNTER — Ambulatory Visit (HOSPITAL_BASED_OUTPATIENT_CLINIC_OR_DEPARTMENT_OTHER): Payer: 59 | Admitting: *Deleted

## 2021-07-21 ENCOUNTER — Ambulatory Visit (HOSPITAL_BASED_OUTPATIENT_CLINIC_OR_DEPARTMENT_OTHER): Payer: 59

## 2021-07-21 ENCOUNTER — Encounter: Payer: Self-pay | Admitting: *Deleted

## 2021-07-21 ENCOUNTER — Ambulatory Visit: Payer: 59 | Admitting: *Deleted

## 2021-07-21 ENCOUNTER — Other Ambulatory Visit: Payer: Self-pay

## 2021-07-21 VITALS — BP 121/78 | HR 41

## 2021-07-21 DIAGNOSIS — Z3A28 28 weeks gestation of pregnancy: Secondary | ICD-10-CM

## 2021-07-21 DIAGNOSIS — O365930 Maternal care for other known or suspected poor fetal growth, third trimester, not applicable or unspecified: Secondary | ICD-10-CM

## 2021-07-21 DIAGNOSIS — R109 Unspecified abdominal pain: Secondary | ICD-10-CM | POA: Diagnosis not present

## 2021-07-21 DIAGNOSIS — O358XX0 Maternal care for other (suspected) fetal abnormality and damage, not applicable or unspecified: Secondary | ICD-10-CM

## 2021-07-21 DIAGNOSIS — O43192 Other malformation of placenta, second trimester: Secondary | ICD-10-CM | POA: Diagnosis not present

## 2021-07-21 DIAGNOSIS — O4593 Premature separation of placenta, unspecified, third trimester: Secondary | ICD-10-CM | POA: Diagnosis not present

## 2021-07-21 DIAGNOSIS — O43193 Other malformation of placenta, third trimester: Secondary | ICD-10-CM | POA: Insufficient documentation

## 2021-07-21 NOTE — Procedures (Signed)
Amy Harvey Onley 04/21/1991 [redacted]w[redacted]d Fetus A Non-Stress Test Interpretation for 07/21/21  Indication: IUGR  Fetal Heart Rate A Mode: External Baseline Rate (A): 130 bpm Variability: Moderate Accelerations: 10 x 10, 15 x 15 Multiple birth?: No  Uterine Activity Mode: Palpation, Toco Contraction Frequency (min): None Resting Tone Palpated: Relaxed Resting Time: Adequate  Interpretation (Fetal Testing) Nonstress Test Interpretation: Reactive Overall Impression: Reassuring for gestational age Comments: Dr. SDonalee Citrinreviewed tracing.

## 2021-07-24 ENCOUNTER — Encounter (HOSPITAL_COMMUNITY)
Admission: AD | Disposition: A | Discharge status: Home / Self Care | Payer: Self-pay | Source: Home / Self Care | Attending: Obstetrics and Gynecology

## 2021-07-24 ENCOUNTER — Inpatient Hospital Stay (HOSPITAL_COMMUNITY): Payer: 59 | Admitting: Anesthesiology

## 2021-07-24 ENCOUNTER — Inpatient Hospital Stay (HOSPITAL_BASED_OUTPATIENT_CLINIC_OR_DEPARTMENT_OTHER): Payer: 59

## 2021-07-24 ENCOUNTER — Other Ambulatory Visit: Payer: Self-pay

## 2021-07-24 ENCOUNTER — Encounter (HOSPITAL_COMMUNITY): Payer: Self-pay | Admitting: Obstetrics and Gynecology

## 2021-07-24 ENCOUNTER — Inpatient Hospital Stay (HOSPITAL_COMMUNITY)
Admission: AD | Admit: 2021-07-24 | Discharge: 2021-07-29 | DRG: 788 | Disposition: A | Discharge status: Home / Self Care | Payer: 59 | Attending: Obstetrics and Gynecology | Admitting: Obstetrics and Gynecology

## 2021-07-24 DIAGNOSIS — O26893 Other specified pregnancy related conditions, third trimester: Secondary | ICD-10-CM

## 2021-07-24 DIAGNOSIS — O1424 HELLP syndrome, complicating childbirth: Secondary | ICD-10-CM | POA: Diagnosis not present

## 2021-07-24 DIAGNOSIS — R109 Unspecified abdominal pain: Secondary | ICD-10-CM

## 2021-07-24 DIAGNOSIS — Z3A29 29 weeks gestation of pregnancy: Secondary | ICD-10-CM

## 2021-07-24 DIAGNOSIS — Z6791 Unspecified blood type, Rh negative: Secondary | ICD-10-CM

## 2021-07-24 DIAGNOSIS — Z20822 Contact with and (suspected) exposure to covid-19: Secondary | ICD-10-CM | POA: Diagnosis present

## 2021-07-24 DIAGNOSIS — O365930 Maternal care for other known or suspected poor fetal growth, third trimester, not applicable or unspecified: Secondary | ICD-10-CM | POA: Diagnosis present

## 2021-07-24 DIAGNOSIS — O4593 Premature separation of placenta, unspecified, third trimester: Secondary | ICD-10-CM | POA: Diagnosis present

## 2021-07-24 DIAGNOSIS — O26899 Other specified pregnancy related conditions, unspecified trimester: Principal | ICD-10-CM

## 2021-07-24 DIAGNOSIS — O43193 Other malformation of placenta, third trimester: Secondary | ICD-10-CM

## 2021-07-24 DIAGNOSIS — O358XX0 Maternal care for other (suspected) fetal abnormality and damage, not applicable or unspecified: Secondary | ICD-10-CM

## 2021-07-24 LAB — CBC WITH DIFFERENTIAL/PLATELET
Abs Immature Granulocytes: 0.09 10*3/uL — ABNORMAL HIGH (ref 0.00–0.07)
Basophils Absolute: 0 10*3/uL (ref 0.0–0.1)
Basophils Relative: 0 %
Eosinophils Absolute: 0.1 10*3/uL (ref 0.0–0.5)
Eosinophils Relative: 0 %
HCT: 43.6 % (ref 36.0–46.0)
Hemoglobin: 15.6 g/dL — ABNORMAL HIGH (ref 12.0–15.0)
Immature Granulocytes: 1 %
Lymphocytes Relative: 9 %
Lymphs Abs: 1.2 10*3/uL (ref 0.7–4.0)
MCH: 31.9 pg (ref 26.0–34.0)
MCHC: 35.8 g/dL (ref 30.0–36.0)
MCV: 89.2 fL (ref 80.0–100.0)
Monocytes Absolute: 0.9 10*3/uL (ref 0.1–1.0)
Monocytes Relative: 7 %
Neutro Abs: 11 10*3/uL — ABNORMAL HIGH (ref 1.7–7.7)
Neutrophils Relative %: 83 %
Platelets: 83 10*3/uL — ABNORMAL LOW (ref 150–400)
RBC: 4.89 MIL/uL (ref 3.87–5.11)
RDW: 12.8 % (ref 11.5–15.5)
WBC: 13.4 10*3/uL — ABNORMAL HIGH (ref 4.0–10.5)
nRBC: 0 % (ref 0.0–0.2)

## 2021-07-24 LAB — RESP PANEL BY RT-PCR (FLU A&B, COVID) ARPGX2
Influenza A by PCR: NEGATIVE
Influenza B by PCR: NEGATIVE
SARS Coronavirus 2 by RT PCR: NEGATIVE

## 2021-07-24 LAB — TYPE AND SCREEN
ABO/RH(D): B NEG
Antibody Screen: POSITIVE

## 2021-07-24 LAB — COMPREHENSIVE METABOLIC PANEL
ALT: 174 U/L — ABNORMAL HIGH (ref 0–44)
AST: 186 U/L — ABNORMAL HIGH (ref 15–41)
Albumin: 3.1 g/dL — ABNORMAL LOW (ref 3.5–5.0)
Alkaline Phosphatase: 113 U/L (ref 38–126)
Anion gap: 6 (ref 5–15)
BUN: 10 mg/dL (ref 6–20)
CO2: 26 mmol/L (ref 22–32)
Calcium: 9.1 mg/dL (ref 8.9–10.3)
Chloride: 103 mmol/L (ref 98–111)
Creatinine, Ser: 0.9 mg/dL (ref 0.44–1.00)
GFR, Estimated: >60 mL/min (ref >60)
Glucose, Bld: 82 mg/dL (ref 70–99)
Potassium: 4 mmol/L (ref 3.5–5.1)
Sodium: 135 mmol/L (ref 135–145)
Total Bilirubin: 0.8 mg/dL (ref 0.3–1.2)
Total Protein: 6.3 g/dL — ABNORMAL LOW (ref 6.5–8.1)

## 2021-07-24 LAB — DIC (DISSEMINATED INTRAVASCULAR COAGULATION)PANEL
D-Dimer, Quant: 3.6 ug/mL-FEU — ABNORMAL HIGH (ref 0.00–0.50)
Fibrinogen: 400 mg/dL (ref 210–475)
INR: 0.9 (ref 0.8–1.2)
Platelets: 61 10*3/uL — ABNORMAL LOW (ref 150–400)
Prothrombin Time: 12.1 seconds (ref 11.4–15.2)
Smear Review: NONE SEEN
aPTT: 28 seconds (ref 24–36)

## 2021-07-24 LAB — FIBRINOGEN: Fibrinogen: 428 mg/dL (ref 210–475)

## 2021-07-24 SURGERY — Surgical Case
Anesthesia: General

## 2021-07-24 MED ORDER — DEXAMETHASONE SODIUM PHOSPHATE 10 MG/ML IJ SOLN
INTRAMUSCULAR | Status: AC
  Filled 2021-07-24: qty 1

## 2021-07-24 MED ORDER — TRANEXAMIC ACID-NACL 1000-0.7 MG/100ML-% IV SOLN
INTRAVENOUS | Status: DC | PRN
  Administered 2021-07-24: 1000 mg via INTRAVENOUS

## 2021-07-24 MED ORDER — SIMETHICONE 80 MG PO CHEW
80.0000 mg | CHEWABLE_TABLET | Freq: Three times a day (TID) | ORAL | Status: DC
  Administered 2021-07-24 – 2021-07-29 (×17): 80 mg via ORAL
  Filled 2021-07-24 (×16): qty 1

## 2021-07-24 MED ORDER — OXYTOCIN-SODIUM CHLORIDE 30-0.9 UT/500ML-% IV SOLN: 2.5000 [IU]/h | INTRAVENOUS | Status: AC

## 2021-07-24 MED ORDER — HYDROMORPHONE HCL 1 MG/ML IJ SOLN: 0.2000 mg | INTRAMUSCULAR | Status: DC | PRN

## 2021-07-24 MED ORDER — ONDANSETRON HCL 4 MG/2ML IJ SOLN: 4.0000 mg | Freq: Once | INTRAMUSCULAR | Status: DC | PRN

## 2021-07-24 MED ORDER — SIMETHICONE 80 MG PO CHEW: 80.0000 mg | CHEWABLE_TABLET | ORAL | Status: DC | PRN

## 2021-07-24 MED ORDER — FENTANYL CITRATE (PF) 100 MCG/2ML IJ SOLN
INTRAMUSCULAR | Status: AC
  Filled 2021-07-24: qty 2

## 2021-07-24 MED ORDER — OXYCODONE HCL 5 MG PO TABS: 5.0000 mg | ORAL_TABLET | Freq: Once | ORAL | Status: DC | PRN

## 2021-07-24 MED ORDER — ACETAMINOPHEN 10 MG/ML IV SOLN
INTRAVENOUS | Status: DC | PRN
  Administered 2021-07-24: 1000 mg via INTRAVENOUS

## 2021-07-24 MED ORDER — SOD CITRATE-CITRIC ACID 500-334 MG/5ML PO SOLN
ORAL | Status: AC
  Filled 2021-07-24: qty 30

## 2021-07-24 MED ORDER — LACTATED RINGERS IV SOLN: INTRAVENOUS | Status: AC

## 2021-07-24 MED ORDER — KETOROLAC TROMETHAMINE 30 MG/ML IJ SOLN
30.0000 mg | Freq: Four times a day (QID) | INTRAMUSCULAR | Status: AC
  Administered 2021-07-24 – 2021-07-25 (×4): 30 mg via INTRAVENOUS
  Filled 2021-07-24 (×4): qty 1

## 2021-07-24 MED ORDER — SUCCINYLCHOLINE CHLORIDE 200 MG/10ML IV SOSY
PREFILLED_SYRINGE | INTRAVENOUS | Status: DC | PRN
  Administered 2021-07-24: 160 mg via INTRAVENOUS

## 2021-07-24 MED ORDER — MAGNESIUM SULFATE 40 GM/1000ML IV SOLN: 2.0000 g/h | INTRAVENOUS | Status: DC

## 2021-07-24 MED ORDER — MAGNESIUM SULFATE BOLUS VIA INFUSION
4.0000 g | Freq: Once | INTRAVENOUS | Status: AC
  Administered 2021-07-24: 4 g via INTRAVENOUS
  Filled 2021-07-24: qty 1000

## 2021-07-24 MED ORDER — MIDAZOLAM HCL 2 MG/2ML IJ SOLN
INTRAMUSCULAR | Status: AC
  Filled 2021-07-24: qty 2

## 2021-07-24 MED ORDER — MAGNESIUM SULFATE 40 GM/1000ML IV SOLN
2.0000 g/h | INTRAVENOUS | Status: AC
  Administered 2021-07-25: 2 g/h via INTRAVENOUS
  Filled 2021-07-24 (×2): qty 1000

## 2021-07-24 MED ORDER — HYDROMORPHONE HCL 1 MG/ML IJ SOLN
INTRAMUSCULAR | Status: AC
  Filled 2021-07-24: qty 0.5

## 2021-07-24 MED ORDER — CEFAZOLIN SODIUM-DEXTROSE 2-4 GM/100ML-% IV SOLN
INTRAVENOUS | Status: AC
  Filled 2021-07-24: qty 100

## 2021-07-24 MED ORDER — TETANUS-DIPHTH-ACELL PERTUSSIS 5-2.5-18.5 LF-MCG/0.5 IM SUSY: 0.5000 mL | PREFILLED_SYRINGE | Freq: Once | INTRAMUSCULAR | Status: DC

## 2021-07-24 MED ORDER — OXYCODONE HCL 5 MG PO TABS
5.0000 mg | ORAL_TABLET | ORAL | Status: DC | PRN
  Administered 2021-07-24: 10 mg via ORAL
  Administered 2021-07-24 – 2021-07-25 (×2): 5 mg via ORAL
  Administered 2021-07-25: 10 mg via ORAL
  Administered 2021-07-25: 5 mg via ORAL
  Administered 2021-07-25: 10 mg via ORAL
  Administered 2021-07-25 – 2021-07-26 (×3): 5 mg via ORAL
  Administered 2021-07-26: 10 mg via ORAL
  Administered 2021-07-27 – 2021-07-29 (×3): 5 mg via ORAL
  Filled 2021-07-24 (×3): qty 2
  Filled 2021-07-24 (×7): qty 1
  Filled 2021-07-24: qty 2
  Filled 2021-07-24 (×2): qty 1

## 2021-07-24 MED ORDER — TRANEXAMIC ACID-NACL 1000-0.7 MG/100ML-% IV SOLN
INTRAVENOUS | Status: AC
  Filled 2021-07-24: qty 100

## 2021-07-24 MED ORDER — PRENATAL MULTIVITAMIN CH
1.0000 | ORAL_TABLET | Freq: Every day | ORAL | Status: DC
  Administered 2021-07-25 – 2021-07-29 (×5): 1 via ORAL
  Filled 2021-07-24 (×5): qty 1

## 2021-07-24 MED ORDER — ACETAMINOPHEN 160 MG/5ML PO SOLN: 325.0000 mg | ORAL | Status: DC | PRN

## 2021-07-24 MED ORDER — COCONUT OIL OIL
1.0000 "application " | TOPICAL_OIL | Status: DC | PRN
  Administered 2021-07-24: 1 via TOPICAL

## 2021-07-24 MED ORDER — OXYCODONE HCL 5 MG/5ML PO SOLN: 5.0000 mg | Freq: Once | ORAL | Status: DC | PRN

## 2021-07-24 MED ORDER — FAMOTIDINE IN NACL 20-0.9 MG/50ML-% IV SOLN
20.0000 mg | Freq: Once | INTRAVENOUS | Status: DC
Start: 2021-07-24 — End: 2021-07-24

## 2021-07-24 MED ORDER — DEXAMETHASONE SODIUM PHOSPHATE 10 MG/ML IJ SOLN
INTRAMUSCULAR | Status: DC | PRN
  Administered 2021-07-24: 10 mg via INTRAVENOUS

## 2021-07-24 MED ORDER — OXYTOCIN-SODIUM CHLORIDE 30-0.9 UT/500ML-% IV SOLN
INTRAVENOUS | Status: AC
  Filled 2021-07-24: qty 500

## 2021-07-24 MED ORDER — MENTHOL 3 MG MT LOZG: 1.0000 | LOZENGE | OROMUCOSAL | Status: DC | PRN

## 2021-07-24 MED ORDER — HYDROMORPHONE HCL 1 MG/ML IJ SOLN
0.5000 mg | INTRAMUSCULAR | Status: DC | PRN
  Administered 2021-07-24: 0.5 mg via INTRAVENOUS

## 2021-07-24 MED ORDER — BETAMETHASONE SOD PHOS & ACET 6 (3-3) MG/ML IJ SUSP: 12.0000 mg | Freq: Once | INTRAMUSCULAR | Status: DC

## 2021-07-24 MED ORDER — PROPOFOL 10 MG/ML IV BOLUS
INTRAVENOUS | Status: AC
  Filled 2021-07-24: qty 20

## 2021-07-24 MED ORDER — CEFAZOLIN SODIUM-DEXTROSE 2-3 GM-%(50ML) IV SOLR
INTRAVENOUS | Status: DC | PRN
  Administered 2021-07-24: 2 g via INTRAVENOUS

## 2021-07-24 MED ORDER — WITCH HAZEL-GLYCERIN EX PADS: 1.0000 "application " | MEDICATED_PAD | CUTANEOUS | Status: DC | PRN

## 2021-07-24 MED ORDER — MIDAZOLAM HCL 5 MG/5ML IJ SOLN
INTRAMUSCULAR | Status: DC | PRN
  Administered 2021-07-24: 2 mg via INTRAVENOUS

## 2021-07-24 MED ORDER — FENTANYL CITRATE (PF) 250 MCG/5ML IJ SOLN
INTRAMUSCULAR | Status: DC | PRN
  Administered 2021-07-24: 250 ug via INTRAVENOUS

## 2021-07-24 MED ORDER — DEXMEDETOMIDINE (PRECEDEX) IN NS 20 MCG/5ML (4 MCG/ML) IV SYRINGE
PREFILLED_SYRINGE | INTRAVENOUS | Status: AC
  Filled 2021-07-24: qty 5

## 2021-07-24 MED ORDER — MAGNESIUM SULFATE 4 GM/100ML IV SOLN
4.0000 g | Freq: Once | INTRAVENOUS | Status: DC
  Filled 2021-07-24: qty 100

## 2021-07-24 MED ORDER — IBUPROFEN 600 MG PO TABS
600.0000 mg | ORAL_TABLET | Freq: Four times a day (QID) | ORAL | Status: DC
  Administered 2021-07-25 – 2021-07-29 (×18): 600 mg via ORAL
  Filled 2021-07-24 (×18): qty 1

## 2021-07-24 MED ORDER — DIBUCAINE (PERIANAL) 1 % EX OINT: 1.0000 "application " | TOPICAL_OINTMENT | CUTANEOUS | Status: DC | PRN

## 2021-07-24 MED ORDER — ACETAMINOPHEN 500 MG PO TABS
1000.0000 mg | ORAL_TABLET | Freq: Four times a day (QID) | ORAL | Status: DC
  Administered 2021-07-24 – 2021-07-29 (×20): 1000 mg via ORAL
  Filled 2021-07-24 (×21): qty 2

## 2021-07-24 MED ORDER — DEXMEDETOMIDINE (PRECEDEX) IN NS 20 MCG/5ML (4 MCG/ML) IV SYRINGE
PREFILLED_SYRINGE | INTRAVENOUS | Status: DC | PRN
  Administered 2021-07-24 (×2): 8 ug via INTRAVENOUS

## 2021-07-24 MED ORDER — OXYTOCIN-SODIUM CHLORIDE 30-0.9 UT/500ML-% IV SOLN
INTRAVENOUS | Status: DC | PRN
  Administered 2021-07-24: 300 mL via INTRAVENOUS

## 2021-07-24 MED ORDER — MEPERIDINE HCL 25 MG/ML IJ SOLN: 6.2500 mg | INTRAMUSCULAR | Status: DC | PRN

## 2021-07-24 MED ORDER — ZOLPIDEM TARTRATE 5 MG PO TABS: 5.0000 mg | ORAL_TABLET | Freq: Every evening | ORAL | Status: DC | PRN

## 2021-07-24 MED ORDER — ONDANSETRON HCL 4 MG/2ML IJ SOLN
INTRAMUSCULAR | Status: AC
  Filled 2021-07-24: qty 2

## 2021-07-24 MED ORDER — FENTANYL CITRATE (PF) 100 MCG/2ML IJ SOLN
INTRAMUSCULAR | Status: DC | PRN
  Administered 2021-07-24 (×2): 50 ug via INTRAVENOUS

## 2021-07-24 MED ORDER — SENNOSIDES-DOCUSATE SODIUM 8.6-50 MG PO TABS
2.0000 | ORAL_TABLET | Freq: Every day | ORAL | Status: DC
  Administered 2021-07-24 – 2021-07-29 (×6): 2 via ORAL
  Filled 2021-07-24 (×6): qty 2

## 2021-07-24 MED ORDER — SOD CITRATE-CITRIC ACID 500-334 MG/5ML PO SOLN: 30.0000 mL | Freq: Once | ORAL | Status: DC

## 2021-07-24 MED ORDER — LACTATED RINGERS IV SOLN: INTRAVENOUS | Status: DC

## 2021-07-24 MED ORDER — HYDROMORPHONE HCL 1 MG/ML IJ SOLN
0.5000 mg | INTRAMUSCULAR | Status: AC | PRN
  Administered 2021-07-24 (×4): 0.5 mg via INTRAVENOUS

## 2021-07-24 MED ORDER — PROPOFOL 500 MG/50ML IV EMUL
INTRAVENOUS | Status: DC | PRN
  Administered 2021-07-24: 30 mg via INTRAVENOUS
  Administered 2021-07-24: 170 mg via INTRAVENOUS
  Administered 2021-07-24: 100 mg via INTRAVENOUS

## 2021-07-24 MED ORDER — ONDANSETRON HCL 4 MG/2ML IJ SOLN
INTRAMUSCULAR | Status: DC | PRN
  Administered 2021-07-24: 4 mg via INTRAVENOUS

## 2021-07-24 MED ORDER — FAMOTIDINE IN NACL 20-0.9 MG/50ML-% IV SOLN
INTRAVENOUS | Status: AC
  Filled 2021-07-24: qty 50

## 2021-07-24 MED ORDER — ACETAMINOPHEN 325 MG PO TABS: 325.0000 mg | ORAL_TABLET | ORAL | Status: DC | PRN

## 2021-07-24 MED ORDER — FENTANYL CITRATE (PF) 100 MCG/2ML IJ SOLN: 25.0000 ug | INTRAMUSCULAR | Status: DC | PRN

## 2021-07-24 MED ORDER — DIPHENHYDRAMINE HCL 25 MG PO CAPS: 25.0000 mg | ORAL_CAPSULE | Freq: Four times a day (QID) | ORAL | Status: DC | PRN

## 2021-07-24 SURGICAL SUPPLY — 36 items
BENZOIN TINCTURE PRP APPL 2/3 (GAUZE/BANDAGES/DRESSINGS) ×2 IMPLANT
CHLORAPREP W/TINT 26ML (MISCELLANEOUS) ×2 IMPLANT
CLAMP CORD UMBIL (MISCELLANEOUS) IMPLANT
CLOSURE STERI STRIP 1/2 X4 (GAUZE/BANDAGES/DRESSINGS) ×2 IMPLANT
CLOTH BEACON ORANGE TIMEOUT ST (SAFETY) ×2 IMPLANT
CLSR STERI-STRIP ANTIMIC 1/2X4 (GAUZE/BANDAGES/DRESSINGS) ×2 IMPLANT
DRSG OPSITE POSTOP 4X10 (GAUZE/BANDAGES/DRESSINGS) ×2 IMPLANT
ELECT REM PT RETURN 9FT ADLT (ELECTROSURGICAL) ×2
ELECTRODE REM PT RTRN 9FT ADLT (ELECTROSURGICAL) ×1 IMPLANT
EXTRACTOR VACUUM KIWI (MISCELLANEOUS) IMPLANT
GAUZE SPONGE 4X4 12PLY STRL LF (GAUZE/BANDAGES/DRESSINGS) ×4 IMPLANT
GLOVE BIO SURGEON STRL SZ 6.5 (GLOVE) ×2 IMPLANT
GLOVE BIOGEL PI IND STRL 6.5 (GLOVE) ×1 IMPLANT
GLOVE BIOGEL PI IND STRL 7.0 (GLOVE) ×2 IMPLANT
GLOVE BIOGEL PI INDICATOR 6.5 (GLOVE) ×1
GLOVE BIOGEL PI INDICATOR 7.0 (GLOVE) ×2
GOWN STRL REUS W/TWL LRG LVL3 (GOWN DISPOSABLE) ×4 IMPLANT
KIT ABG SYR 3ML LUER SLIP (SYRINGE) ×2 IMPLANT
NEEDLE HYPO 25X5/8 SAFETYGLIDE (NEEDLE) ×2 IMPLANT
NS IRRIG 1000ML POUR BTL (IV SOLUTION) ×2 IMPLANT
PACK C SECTION WH (CUSTOM PROCEDURE TRAY) ×2 IMPLANT
PAD ABD 7.5X8 STRL (GAUZE/BANDAGES/DRESSINGS) ×2 IMPLANT
PAD OB MATERNITY 4.3X12.25 (PERSONAL CARE ITEMS) ×2 IMPLANT
PENCIL SMOKE EVAC W/HOLSTER (ELECTROSURGICAL) ×2 IMPLANT
SUT PLAIN 0 NONE (SUTURE) IMPLANT
SUT PLAIN 2 0 (SUTURE) ×1
SUT PLAIN ABS 2-0 CT1 27XMFL (SUTURE) ×1 IMPLANT
SUT VIC AB 0 CT1 36 (SUTURE) ×2 IMPLANT
SUT VIC AB 0 CTX 36 (SUTURE) ×2
SUT VIC AB 0 CTX36XBRD ANBCTRL (SUTURE) ×2 IMPLANT
SUT VIC AB 3-0 SH 27 (SUTURE) ×2
SUT VIC AB 3-0 SH 27X BRD (SUTURE) ×2 IMPLANT
SUT VIC AB 4-0 PS2 27 (SUTURE) ×2 IMPLANT
TOWEL OR 17X24 6PK STRL BLUE (TOWEL DISPOSABLE) ×2 IMPLANT
TRAY FOLEY W/BAG SLVR 14FR LF (SET/KITS/TRAYS/PACK) IMPLANT
WATER STERILE IRR 1000ML POUR (IV SOLUTION) ×2 IMPLANT

## 2021-07-24 NOTE — Transfer of Care (Addendum)
Immediate Anesthesia Transfer of Care Note  Patient: Amy Harvey  Procedure(s) Performed: CESAREAN SECTION  Patient Location: PACU  Anesthesia Type:General  Level of Consciousness: sedated and drowsy  Airway & Oxygen Therapy: Patient Spontanous Breathing and Patient connected to nasal cannula oxygen  Post-op Assessment: Report given to RN and Post -op Vital signs reviewed and stable  Post vital signs: Reviewed and stable  Last Vitals:  Vitals Value Taken Time  BP 137/88 07/24/21 1145  Temp 36.4 C 07/24/21 1130  Pulse 39 07/24/21 1200  Resp 17 07/24/21 1200  SpO2 96 % 07/24/21 1200  Vitals shown include unvalidated device data.  Last Pain:  Vitals:   07/24/21 1155  TempSrc:   PainSc: 7          Complications: No notable events documented.

## 2021-07-24 NOTE — MAU Provider Note (Signed)
CNM notified by Dr. Lynnette Caffey of patient coming due to severe pain and HTN.   Upon arrival, patient doubled over in pain and crying. Ultrasound at bedside to perform bedside ultrasound and placental abruption seen. Dr. Royston Sinner notified and will come to bedside. Code Cesarean called. FHR 145bpm on external monitoring.   Wende Mott, CNM 07/24/21 10:14 AM

## 2021-07-24 NOTE — Anesthesia Preprocedure Evaluation (Signed)
Anesthesia Evaluation  Patient identified by MRN, date of birth, ID band Patient awake    Reviewed: Allergy & Precautions, H&P , NPO status Preop documentation limited or incomplete due to emergent nature of procedure.  Airway Mallampati: II  TM Distance: >3 FB Neck ROM: full    Dental no notable dental hx. (+) Teeth Intact, Dental Advisory Given   Pulmonary neg pulmonary ROS,    Pulmonary exam normal breath sounds clear to auscultation       Cardiovascular negative cardio ROS Normal cardiovascular exam Rhythm:regular Rate:Normal     Neuro/Psych PSYCHIATRIC DISORDERS Anxiety Depression negative neurological ROS     GI/Hepatic negative GI ROS, Neg liver ROS,   Endo/Other  negative endocrine ROS  Renal/GU negative Renal ROS  negative genitourinary   Musculoskeletal   Abdominal   Peds  Hematology negative hematology ROS (+)   Anesthesia Other Findings   Reproductive/Obstetrics (+) Pregnancy                             Anesthesia Physical Anesthesia Plan  ASA: 3 and emergent  Anesthesia Plan: General   Post-op Pain Management:    Induction: Intravenous, Rapid sequence and Cricoid pressure planned  PONV Risk Score and Plan: 2  Airway Management Planned: Oral ETT  Additional Equipment: None  Intra-op Plan:   Post-operative Plan: Extubation in OR  Informed Consent: I have reviewed the patients History and Physical, chart, labs and discussed the procedure including the risks, benefits and alternatives for the proposed anesthesia with the patient or authorized representative who has indicated his/her understanding and acceptance.       Plan Discussed with: Anesthesiologist  Anesthesia Plan Comments: (  )        Anesthesia Quick Evaluation

## 2021-07-24 NOTE — H&P (Signed)
Amy Harvey is a 30 y.o. female presenting for severe abdominal pain. Was seen in the office yesterday and noted to be feeling well and normotensive. Is being closely followed for severe IUGR. Today sudden onset elevated Bps and severe abdominal pain. [place] in office showed possible abruption. Sent here immediately and on Immediate [place] here, a placental abruption was noted. Given abruption and severe abdominal pain, immediate delivery is indicated.   OB History     Gravida  1   Para      Term      Preterm      AB      Living         SAB      IAB      Ectopic      Multiple      Live Births             Past Medical History:  Diagnosis Date   Allergy    grass, dust mites, white oak and hickory   Anxiety    Anxiety    Depression    Dysplastic nevus 04/24/2008   R mid med scapula - mild    Dysplastic nevus 04/24/2008   R upper back 2.5 cm lat to spine - mild    Dysplastic nevus 03/10/2017   L post shoulder axillary area - mild    Dysplastic nevus 03/13/2018   R upper back 3.5 cm lat to spine - severe, excision 07/11/2018   Dysplastic nevus 03/21/2019   R back 3.5 cm lat to spine - mod    Dysplastic nevus 03/16/2021   left ear sup helix - sup margins fee but close - recheck on follow up 09/2021   Vaginal Pap smear, abnormal    Past Surgical History:  Procedure Laterality Date   TONSILLECTOMY AND ADENOIDECTOMY     as child   WISDOM TOOTH EXTRACTION     Family History: family history includes Alzheimer's disease in her maternal grandmother; Breast cancer (age of onset: 46) in her mother; Cirrhosis in her father and paternal grandfather; Coronary artery disease in her father and paternal grandfather; Diabetes in her father and paternal grandfather; Heart attack in her father; Hypertension in her father; Stroke in her paternal grandmother; Vision loss in her paternal grandmother. Social History:  reports that she has never smoked. She has never used smokeless  tobacco. She reports current alcohol use. She reports that she does not use drugs.     Maternal Diabetes: No Genetic Screening: Normal Maternal Ultrasounds/Referrals: IUGR Fetal Ultrasounds or other Referrals:  None Maternal Substance Abuse:  No Significant Maternal Medications:  None Significant Maternal Lab Results:  Other:  AFP elevation Other Comments:  None  Review of Systems History   Last menstrual period 01/01/2021. Exam Physical Exam  A&O, writhing in pain NWOB Abd soft, nondistended, gravid, tender throughout SVE deferred  Prenatal labs: ABO, Rh:   Antibody:   Rubella:   RPR:    HBsAg:    HIV:    GBS:   unknown  Assessment/Plan: 30 yo G1P0 @ 29.1 wga with placental abruption in severe pain - to OR now.  Risks discussed including infection, bleeding, damage to surrounding structures, need for additional procedures, postoperative DVT and need to additional procedures. Also d/w patient and husband that this will likely be a classical cesarean section and she will be unable to have vaginal deliveries in the future. D/w patient that with severe IUGR and 29 weeks, prognosis of baby is unclear.  NICU to see present at delivery. BMZ given. All questions answered. Consent signed.    Colin Benton Amy Harvey 07/24/2021, 10:14 AM

## 2021-07-24 NOTE — MAU Note (Addendum)
Sent in with sudden onset HTN.  Pt ass't to rm via wc.  Very uncomfortable. IUGR, possible abruption. [place] was called to rm for stat bedside. Maryruth Hancock CNM present

## 2021-07-24 NOTE — Op Note (Signed)
PROCEDURE DATE: 07/24/21   PREOPERATIVE DIAGNOSIS: placental abruption, elevated Bps, severe IUGR   POSTOPERATIVE DIAGNOSIS: The same plus HELLP syndrome   PROCEDURE:  Classical Cesarean Section   SURGEON:  Dr. Lucillie Garfinkel  ASSISTANT: Dr. Radene Gunning   INDICATIONS: This is a 30yo G1 at 29.1 wga requiring cesarean section secondary to placental abruption. She presented with acute onset of severe abdominal pain and found to have new onset +3 edema with Bps 150s/90-100s. Abruption was confirmed on [place] immediately upon arrival. She therefore was taken emergently to the OR. Labs were not back until after the procedure was finished.  BMZ given and taken straight to the OR. Loading dose of Magnesium for CP prophylaxis unable to be given s/s emergent nature of case. The risks of cesarean section discussed with the patient included but were not limited to: bleeding which may require transfusion or reoperation; infection which may require antibiotics; injury to bowel, bladder, ureters or other surrounding organs; injury to the fetus; need for additional procedures including hysterectomy in the event of a life-threatening hemorrhage; placental abnormalities wth subsequent pregnancies, incisional problems, thromboembolic phenomenon and other postoperative/anesthesia complications. The patient agreed with the proposed plan, giving informed consent for the procedure.   After the procedure was finished, the labs finally resulted demonstrating likely HELLP syndrome with Plts 83k and elevated LFTs. Thus, Magnesium was started in the PACU.    FINDINGS:  Viable female infant in vertex presentation, APGARspending,  Weight pending, Amniotic fluid bloody,  Intact placenta but with fundal abruption (~300cc clot adherent to placental bed), three vessel cord.  Grossly normal uterus otherwise.  .   ANESTHESIA:    Epidural ESTIMATED BLOOD LOSS: 400cc SPECIMENS: Placenta for pathology COMPLICATIONS: None immediate    PROCEDURE IN DETAIL:  The patient received intravenous antibiotics (2g Ancef) and had sequential compression devices applied to her lower extremities while in the preoperative area.  She was then taken to the operating room where epidural anesthesia was dosed up to surgical level and was found to be adequate. She was then placed in a dorsal supine position with a leftward tilt, and prepped and draped in a sterile manner.  A foley catheter was placed into her bladder and attached to constant gravity.  After an adequate timeout was performed, a Pfannenstiel skin incision was made with scalpel and carried through to the underlying layer of fascia. Sandria Manly entry was performed. The rectus muscles were separated in the midline bluntly and the peritoneum was entered bluntly.  A classical hysterotomy was made with a scalpel and extended bluntly. The infant was successfully delivered, and cord was clamped and cut and infant was handed over to awaiting neonatology team. Uterine massage was then administered and the placenta delivered with three-vessel cord. Cord gases were taken. The uterus was cleared of clot and debris.  The vertical hysterotomy was closed with 0 vicryl.  A second imbricating suture of 0-vicryl was used to reinforce the incision and aid in hemostasis. A baseball stitch done to close the serosal layer. Three additional figure of eights needed to achieve homeostasis. The fascia was closed with 0-Vicryl in a running fashion with good restoration of anatomy.  The subcutaneus tissue was irrigated and was reapproximated using three interrupted plain gut stitches.  The skin was closed with 4-0 Vicryl in a subcuticular fashion.  All surgical site and was hemostatic at end of procedure) without any further bleeding on exam.    Pt tolerated the procedure well. All sponge/lap/needle counts were correct  X 2. Pt taken to recovery room in stable condition.     Lucillie Garfinkel MD

## 2021-07-24 NOTE — Anesthesia Postprocedure Evaluation (Signed)
Anesthesia Post Note  Patient: Amy Harvey  Procedure(s) Performed: [place]     Patient location during evaluation: PACU Anesthesia Type: General Level of consciousness: awake and alert Pain management: pain level controlled Vital Signs Assessment: post-procedure vital signs reviewed and stable Respiratory status: spontaneous breathing, nonlabored ventilation, respiratory function stable and patient connected to nasal cannula oxygen Cardiovascular status: blood pressure returned to baseline and stable Postop Assessment: no apparent nausea or vomiting Anesthetic complications: no   No notable events documented.  Last Vitals:  Vitals:   07/24/21 1418 07/24/21 1539  BP: 128/87 126/84  Pulse: (!) 42 (!) 46  Resp: 16   Temp: 36.6 C   SpO2: 95% 94%    Last Pain:  Vitals:   07/24/21 1418  TempSrc: Oral  PainSc:    Pain Goal: Patients Stated Pain Goal: 4 (07/24/21 1315)                 [name]

## 2021-07-24 NOTE — Anesthesia Procedure Notes (Addendum)
Procedure Name: Intubation Date/Time: 07/24/2021 10:20 AM Performed by: Harden Mo, CRNA Pre-anesthesia Checklist: Patient identified, Patient being monitored, Timeout performed, Emergency Drugs available and Suction available Patient Re-evaluated:Patient Re-evaluated prior to induction Oxygen Delivery Method: Circle System Utilized Preoxygenation: Pre-oxygenation with 100% oxygen Induction Type: IV induction, Rapid sequence and Cricoid Pressure applied Laryngoscope Size: Mac, 3 and Glidescope Grade View: Grade II Tube type: Oral Tube size: 7.0 mm Number of attempts: 1 Airway Equipment and Method: stylet Placement Confirmation: ETT inserted through vocal cords under direct vision, positive ETCO2 and breath sounds checked- equal and bilateral Secured at: 21 cm Tube secured with: Tape Dental Injury: Teeth and Oropharynx as per pre-operative assessment

## 2021-07-24 NOTE — Progress Notes (Signed)
Subjective: Postpartum Day 0: Classical Cesarean Delivery s/s HELLP syndrome, placental abruption, severe IUGR (<1%ile).   LATE ENTRY S/S PATIENT CARE. Patient is asleep - still groggy from anesthesia.   Objective: Vital signs in last 24 hours: Temp:  [97.5 F (36.4 C)-98.1 F (36.7 C)] 97.6 F (36.4 C) (09/09 1215) Pulse Rate:  [39-50] 39 (09/09 1245) Resp:  [12-40] 15 (09/09 1245) BP: (125-155)/(88-97) 142/93 (09/09 1245) SpO2:  [91 %-100 %] 91 % (09/09 1245)  Physical Exam:  General:  Asleep Lochia: appropriate Uterine Fundus: firm Incision: healing well, no significant drainage, no dehiscence, no significant erythema DVT Evaluation: No evidence of DVT seen on physical exam.  Recent Labs    07/24/21 1005  HGB 15.6*  HCT 43.6    Assessment/Plan: 30 yo G1P1 only 1 hour postop from emergency classical cesarean section s/s placental abruption, HELLP syndrome, and severe IUGR (<1%ile). Increased risk of PPH s/p classical cesarean section and thrombocytopenia. Postop fibrinogen WNL. D/w patient's husband plan of care.   # HELLP syndrome:  - Bps 150s/90s - Start Magnesium - AST 186 - ALT 174 - PLTs 83k - CRT 0.9 - Hgb WNL - ctm Bps and symptoms closely. Repeat labs in AM  # Postop:  - baby girl stable and intubated in the NICU - Given need for classical, benefits outweigh risks of ibuprofen use as long as PLTs stay above 50k - Cont ibuprofen, tylenol, and prn oxy  # RH neg - for rhogam eval  Lucillie Garfinkel MD  07/24/2021, 12:57 PM

## 2021-07-25 ENCOUNTER — Encounter (HOSPITAL_COMMUNITY): Payer: Self-pay | Admitting: Obstetrics and Gynecology

## 2021-07-25 LAB — CBC
HCT: 35.2 % — ABNORMAL LOW (ref 36.0–46.0)
Hemoglobin: 12.9 g/dL (ref 12.0–15.0)
MCH: 32.1 pg (ref 26.0–34.0)
MCHC: 36.6 g/dL — ABNORMAL HIGH (ref 30.0–36.0)
MCV: 87.6 fL (ref 80.0–100.0)
Platelets: 83 10*3/uL — ABNORMAL LOW (ref 150–400)
RBC: 4.02 MIL/uL (ref 3.87–5.11)
RDW: 12.8 % (ref 11.5–15.5)
WBC: 15.2 10*3/uL — ABNORMAL HIGH (ref 4.0–10.5)
nRBC: 0 % (ref 0.0–0.2)

## 2021-07-25 LAB — COMPREHENSIVE METABOLIC PANEL
ALT: 122 U/L — ABNORMAL HIGH (ref 0–44)
AST: 130 U/L — ABNORMAL HIGH (ref 15–41)
Albumin: 2.4 g/dL — ABNORMAL LOW (ref 3.5–5.0)
Alkaline Phosphatase: 96 U/L (ref 38–126)
Anion gap: 9 (ref 5–15)
BUN: 12 mg/dL (ref 6–20)
CO2: 23 mmol/L (ref 22–32)
Calcium: 7.5 mg/dL — ABNORMAL LOW (ref 8.9–10.3)
Chloride: 99 mmol/L (ref 98–111)
Creatinine, Ser: 0.9 mg/dL (ref 0.44–1.00)
GFR, Estimated: >60 mL/min (ref >60)
Glucose, Bld: 104 mg/dL — ABNORMAL HIGH (ref 70–99)
Potassium: 4.5 mmol/L (ref 3.5–5.1)
Sodium: 131 mmol/L — ABNORMAL LOW (ref 135–145)
Total Bilirubin: 0.5 mg/dL (ref 0.3–1.2)
Total Protein: 5.4 g/dL — ABNORMAL LOW (ref 6.5–8.1)

## 2021-07-25 LAB — RPR: RPR Ser Ql: NONREACTIVE

## 2021-07-25 NOTE — Progress Notes (Signed)
Subjective: Postpartum Day 1: Classical Cesarean Delivery s/s HELLP syndrome, placental abruption, severe IUGR (<1%ile).  Doing well. Denies HA, change in vision, SOB, CP and worsening swelling.  Ambulating without difficulty. Wants to see baby girl.   Objective: Vital signs in last 24 hours: Temp:  [97.5 F (36.4 C)-98.1 F (36.7 C)] 98 F (36.7 C) (09/10 0200) Pulse Rate:  [39-58] 49 (09/10 0200) Resp:  [12-40] 16 (09/10 0200) BP: (125-155)/(82-97) 130/82 (09/10 0200) SpO2:  [91 %-100 %] 97 % (09/09 1939) Weight:  [91.6 kg] 91.6 kg (09/09 1418)  Physical Exam:  General:  Asleep Lochia: appropriate Uterine Fundus: firm Incision: healing well, no significant drainage, no dehiscence, no significant erythema DVT Evaluation: No evidence of DVT seen on physical exam.  Recent Labs    07/24/21 1005  HGB 15.6*  HCT 43.6     Assessment/Plan: 30 yo G1P1 POD#1 from emergency classical cesarean section s/s placental abruption, HELLP syndrome, and severe IUGR (<1%ile). Increased risk of PPH s/p classical cesarean section and thrombocytopenia. Postop fibrinogen WNL. D/w patient events surrounding delivery and HELLP syndrome diagnosis. We reviewed implications for future deliveries and pregnancies.   # HELLP syndrome:  - no s/s worsening disease by symptoms or vs - Magnesium x 24 hours pp - AST 186/130 - ALT 174/122 - PLTs 83k/61/83 - CRT 0.9 - Hgb WNL - ctm Bps and symptoms closely.  Defer further labs given downtrending. Repeat PRN s/s worsening disease.   # Postop:  - baby girl stable and intubated in the NICU - Given need for classical, benefits outweigh risks of ibuprofen use as long as PLTs stay above 50k - Cont ibuprofen, tylenol, and prn oxy  # RH neg - for rhogam   Lucillie Garfinkel MD  07/25/2021, 5:11 AM

## 2021-07-25 NOTE — Lactation Note (Signed)
This note was copied from a baby's chart. Lactation Consultation Note  Patient Name: Amy Harvey [id number] Date: 07/25/2021 Reason for consult: Initial assessment;NICU baby;Infant < 6lbs;1st time breastfeeding;Primapara;Preterm <34wks Age:29 hours  Visited with mom of 26 hours pre-term NICU female, she's a P1 and reported (+) breast changes during the pregnancy. LC assisted mom with hand expression; she already started pumping yesterday and reports getting some drops, praised her for her efforts.  Reviewed benefits of breastmilk for NICU babies, lactogenesis II, pumping schedule and breastmilk storage guidelines.  Plan of care:  Encouraged mom to continue pumping consistently every 3 hours, at least 8 pumping sessions/24 hours Breast massage,hand expression and coconut oil were also encouraged prior pumping  BF brochure, BF resources and NICU booklet were reviewed. Parents also got additional yellow sheets to continue the pumping log. FOB present and very supportive. All questions and concerns answered, parents to call NICU LC PRN.  Maternal Data Has patient been taught Hand Expression?: Yes Does the patient have breastfeeding experience prior to this delivery?: No  Feeding Mother's Current Feeding Choice: Breast Milk  Lactation Tools Discussed/Used Tools: Pump;Flanges Flange Size: 24 Breast pump type: Double-Electric Breast Pump Pump Education: Setup, frequency, and cleaning;Milk Storage Reason for Pumping: pre-term infant in NICU Pumping frequency: 5 times/24 hours Pumped volume: 1 mL  Interventions Interventions: Breast feeding basics reviewed;DEBP;Education;Breast massage;Hand express  Discharge Pump: DEBP;Stork Pump (will place order today for stork pump) WIC Program: No  Consult Status Consult Status: Follow-up Date: 07/25/21 Follow-up type: In-patient   Ura Yingling Francene Boyers 07/25/2021, 12:57 PM

## 2021-07-26 MED ORDER — RHO D IMMUNE GLOBULIN 1500 UNIT/2ML IJ SOSY
300.0000 ug | PREFILLED_SYRINGE | Freq: Once | INTRAMUSCULAR | Status: AC
  Administered 2021-07-26: 300 ug via INTRAMUSCULAR
  Filled 2021-07-26: qty 2

## 2021-07-26 NOTE — Progress Notes (Signed)
Subjective: Postpartum Day 2: Classical Cesarean Delivery s/s HELLP syndrome, placental abruption, severe IUGR (<1%ile).  Doing well. Denies HA, change in vision, SOB, CP and worsening swelling.  Ambulating without difficulty. Voiding normally. Passing flatus.   Objective: Vital signs in last 24 hours: Temp:  [97.5 F (36.4 C)-98.1 F (36.7 C)] 97.6 F (36.4 C) (09/11 0743) Pulse Rate:  [58-65] 61 (09/11 0743) Resp:  [15-19] 18 (09/11 0743) BP: (125-144)/(80-95) 125/80 (09/11 0743) SpO2:  [98 %-99 %] 99 % (09/11 0743)  Physical Exam:  General: alert and cooperative Lochia: appropriate Uterine Fundus: firm Incision: healing well, no significant drainage, no dehiscence, no significant erythema DVT Evaluation: No evidence of DVT seen on physical exam.  Recent Labs    07/24/21 1005 07/25/21 0508  HGB 15.6* 12.9  HCT 43.6 35.2*     Assessment/Plan: 30 yo G1P1 POD#2 from emergency classical cesarean section s/s placental abruption, HELLP syndrome, and severe IUGR (<1%ile).    # HELLP syndrome:  - no s/s worsening disease by symptoms or vs - s/p Magnesium x 24 hours pp. Bps good off mag - AST 186/130 - ALT 174/122 - PLTs 83k/61/83 - CRT 0.9 - Hgb WNL - ctm Bps and symptoms closely.  Defer further labs given downtrending. Repeat PRN s/s worsening disease.   # Postop:  - baby girl stable and intubated in the NICU - Given need for classical, benefits outweigh risks of ibuprofen use as long as PLTs stay above 50k - Cont ibuprofen, tylenol, and prn oxy  # RH neg - for rhogam   Lucillie Garfinkel MD  07/26/2021, 8:32 AM

## 2021-07-27 ENCOUNTER — Ambulatory Visit: Payer: 59

## 2021-07-27 LAB — RH IG WORKUP (INCLUDES ABO/RH)
Fetal Screen: NEGATIVE
Gestational Age(Wks): 29
Unit division: 0

## 2021-07-27 LAB — SURGICAL PATHOLOGY

## 2021-07-27 NOTE — Lactation Note (Signed)
This note was copied from a baby's chart. Lactation Consultation Note Mother's milk is transitioning and breasts are filling. She continues to use the pump frequently and without difficulty. I provided ice/heat to aid in preventing engorgement. Will plan f/u to further assess.    Patient Name: Amy Harvey [id number] Date: 07/27/2021 Reason for consult: Follow-up assessment Age:30 hours  Maternal Data  Pumping frequency: q3 Pumped volume: 30 mL  Feeding Mother's Current Feeding Choice: Breast Milk  Interventions Interventions: Ice, heat packs Education on IDF Discharge Discharge Education: Engorgement and breast care  Consult Status Consult Status: Follow-up Date: 07/27/21 Follow-up type: In-patient   Gwynne Edinger 07/27/2021, 2:00 PM

## 2021-07-27 NOTE — Progress Notes (Signed)
Subjective: Postpartum Day 3: Cesarean Delivery Patient reports tolerating PO, + flatus, and no problems voiding.    Objective: Vital signs in last 24 hours: Temp:  [98.1 F (36.7 C)-98.4 F (36.9 C)] 98.2 F (36.8 C) (09/12 0833) Pulse Rate:  [54-71] 68 (09/12 0833) Resp:  [17-18] 18 (09/12 0833) BP: (131-164)/(84-96) 142/96 (09/12 0833) SpO2:  [98 %-100 %] 100 % (09/12 [id number])  Physical Exam:  General: alert, cooperative, appears stated age, and mild distress Lochia: appropriate Uterine Fundus: firm Incision: healing well, no significant drainage DVT Evaluation: No evidence of DVT seen on physical exam.  Recent Labs    07/24/21 1005 07/25/21 0508  HGB 15.6* 12.9  HCT 43.6 35.2*    Assessment/Plan: Status post Cesarean section. Postoperative course complicated by HELLP syndrome and Classical C/S for Severe IUGR   Baby in NICU intubate Repeat labs tomorrow Clinically stable.  Luz Lex 07/27/2021, 10:04 AM

## 2021-07-28 LAB — CBC
HCT: 33.6 % — ABNORMAL LOW (ref 36.0–46.0)
Hemoglobin: 11.6 g/dL — ABNORMAL LOW (ref 12.0–15.0)
MCH: 31.4 pg (ref 26.0–34.0)
MCHC: 34.5 g/dL (ref 30.0–36.0)
MCV: 91.1 fL (ref 80.0–100.0)
Platelets: 99 10*3/uL — ABNORMAL LOW (ref 150–400)
RBC: 3.69 MIL/uL — ABNORMAL LOW (ref 3.87–5.11)
RDW: 13.2 % (ref 11.5–15.5)
WBC: 8.3 10*3/uL (ref 4.0–10.5)
nRBC: 0 % (ref 0.0–0.2)

## 2021-07-28 LAB — COMPREHENSIVE METABOLIC PANEL
ALT: 200 U/L — ABNORMAL HIGH (ref 0–44)
AST: 200 U/L — ABNORMAL HIGH (ref 15–41)
Albumin: 2.4 g/dL — ABNORMAL LOW (ref 3.5–5.0)
Alkaline Phosphatase: 94 U/L (ref 38–126)
Anion gap: 7 (ref 5–15)
BUN: 10 mg/dL (ref 6–20)
CO2: 25 mmol/L (ref 22–32)
Calcium: 8.5 mg/dL — ABNORMAL LOW (ref 8.9–10.3)
Chloride: 104 mmol/L (ref 98–111)
Creatinine, Ser: 0.7 mg/dL (ref 0.44–1.00)
GFR, Estimated: >60 mL/min (ref >60)
Glucose, Bld: 78 mg/dL (ref 70–99)
Potassium: 4.1 mmol/L (ref 3.5–5.1)
Sodium: 136 mmol/L (ref 135–145)
Total Bilirubin: 0.4 mg/dL (ref 0.3–1.2)
Total Protein: 4.9 g/dL — ABNORMAL LOW (ref 6.5–8.1)

## 2021-07-28 MED ORDER — NIFEDIPINE ER OSMOTIC RELEASE 30 MG PO TB24
30.0000 mg | ORAL_TABLET | Freq: Two times a day (BID) | ORAL | Status: DC
  Administered 2021-07-28 – 2021-07-29 (×3): 30 mg via ORAL
  Filled 2021-07-28 (×3): qty 1

## 2021-07-28 NOTE — Progress Notes (Signed)
Subjective: Postpartum Day 4: Cesarean Delivery Patient reports tolerating PO, + flatus, and no problems voiding.  No HA, vision change, RUQ pain, CP/SOB.  Feeling well emotionally with baby in NICU.  Objective: Vital signs in last 24 hours: Temp:  [98 F (36.7 C)-98.5 F (36.9 C)] 98.3 F (36.8 C) (09/13 0902) Pulse Rate:  [58-71] 61 (09/13 0902) Resp:  [16-18] 18 (09/13 0902) BP: (136-153)/(88-103) 143/103 (09/13 0902) SpO2:  [98 %-100 %] 98 % (09/13 0902)  Physical Exam:  General: alert, cooperative, and appears stated age 30: appropriate Uterine Fundus: firm Incision: healing well, no significant drainage, no dehiscence, Honeycomb dressing and steri-strips were replaced yesterday DVT Evaluation: No evidence of DVT seen on physical exam. Negative Homan's sign. No cords or calf tenderness.  CBC Latest Ref Rng & Units 07/28/2021 07/25/2021 07/24/2021  WBC 4.0 - 10.5 K/uL 8.3 15.2(H) -  Hemoglobin 12.0 - 15.0 g/dL 11.6(L) 12.9 -  Hematocrit 36.0 - 46.0 % 33.6(L) 35.2(L) -  Platelets 150 - 400 K/uL 99(L) 83(L) 61(L)   CMP Latest Ref Rng & Units 07/28/2021 07/25/2021 07/24/2021  Glucose 70 - 99 mg/dL 78 104(H) 82  BUN 6 - 20 mg/dL '10 12 10  '$ Creatinine 0.44 - 1.00 mg/dL 0.70 0.90 0.90  Sodium 135 - 145 mmol/L 136 131(L) 135  Potassium 3.5 - 5.1 mmol/L 4.1 4.5 4.0  Chloride 98 - 111 mmol/L 104 99 103  CO2 22 - 32 mmol/L '25 23 26  '$ Calcium 8.9 - 10.3 mg/dL 8.5(L) 7.5(L) 9.1  Total Protein 6.5 - 8.1 g/dL 4.9(L) 5.4(L) 6.3(L)  Total Bilirubin 0.3 - 1.2 mg/dL 0.4 0.5 0.8  Alkaline Phos 38 - 126 U/L 94 96 113  AST 15 - 41 U/L 200(H) 130(H) 186(H)  ALT 0 - 44 U/L 200(H) 122(H) 174(H)     Assessment/Plan: Status post Cesarean section. Doing well postoperatively.  HELLP Syndrome-delivered by classical C/S at 29 weeks with abruption.  PLT improved but LFTs still elevated.  BPs are still high mild range.  Procardia 30 XL BID started.  Will reassess this pm to see if BPs are improved,  will consider discharge with f/u in office on Friday for BP and labs recheck.  Linda Hedges 07/28/2021, 9:19 AM

## 2021-07-28 NOTE — Progress Notes (Addendum)
Bps are improved on Procardia 30XL BID.  Will continue to monitor and likely d/c home tomorrow AM.  Will recheck AM labs.  Linda Hedges, DO

## 2021-07-29 ENCOUNTER — Ambulatory Visit: Payer: 59 | Admitting: Family Medicine

## 2021-07-29 LAB — COMPREHENSIVE METABOLIC PANEL
ALT: 224 U/L — ABNORMAL HIGH (ref 0–44)
AST: 156 U/L — ABNORMAL HIGH (ref 15–41)
Albumin: 2.8 g/dL — ABNORMAL LOW (ref 3.5–5.0)
Alkaline Phosphatase: 102 U/L (ref 38–126)
Anion gap: 10 (ref 5–15)
BUN: 9 mg/dL (ref 6–20)
CO2: 25 mmol/L (ref 22–32)
Calcium: 9.1 mg/dL (ref 8.9–10.3)
Chloride: 101 mmol/L (ref 98–111)
Creatinine, Ser: 0.71 mg/dL (ref 0.44–1.00)
GFR, Estimated: >60 mL/min (ref >60)
Glucose, Bld: 107 mg/dL — ABNORMAL HIGH (ref 70–99)
Potassium: 3.8 mmol/L (ref 3.5–5.1)
Sodium: 136 mmol/L (ref 135–145)
Total Bilirubin: 0.5 mg/dL (ref 0.3–1.2)
Total Protein: 5.5 g/dL — ABNORMAL LOW (ref 6.5–8.1)

## 2021-07-29 LAB — CBC
HCT: 33.6 % — ABNORMAL LOW (ref 36.0–46.0)
Hemoglobin: 11.6 g/dL — ABNORMAL LOW (ref 12.0–15.0)
MCH: 31.4 pg (ref 26.0–34.0)
MCHC: 34.5 g/dL (ref 30.0–36.0)
MCV: 91.1 fL (ref 80.0–100.0)
Platelets: 124 10*3/uL — ABNORMAL LOW (ref 150–400)
RBC: 3.69 MIL/uL — ABNORMAL LOW (ref 3.87–5.11)
RDW: 13.4 % (ref 11.5–15.5)
WBC: 7.2 10*3/uL (ref 4.0–10.5)
nRBC: 0 % (ref 0.0–0.2)

## 2021-07-29 MED ORDER — OXYCODONE HCL 5 MG PO TABS: 5.0000 mg | ORAL_TABLET | Freq: Four times a day (QID) | ORAL | 0 refills | Status: DC | PRN

## 2021-07-29 MED ORDER — IBUPROFEN 600 MG PO TABS: 600.0000 mg | ORAL_TABLET | Freq: Four times a day (QID) | ORAL | 0 refills | Status: DC | PRN

## 2021-07-29 MED ORDER — ACETAMINOPHEN 500 MG PO TABS: 1000.0000 mg | ORAL_TABLET | Freq: Four times a day (QID) | ORAL | 0 refills | Status: DC | PRN

## 2021-07-29 MED ORDER — NIFEDIPINE ER OSMOTIC RELEASE 30 MG PO TB24: 30.0000 mg | ORAL_TABLET | Freq: Two times a day (BID) | ORAL | 1 refills | Status: DC

## 2021-07-29 MED ORDER — ACETAMINOPHEN 500 MG PO TABS: 500.0000 mg | ORAL_TABLET | Freq: Four times a day (QID) | ORAL | 0 refills | Status: DC | PRN

## 2021-07-29 MED ORDER — OXYCODONE HCL 5 MG PO TABS
5.0000 mg | ORAL_TABLET | Freq: Four times a day (QID) | ORAL | 0 refills | Status: DC | PRN
Start: 2021-07-29 — End: 2022-04-09

## 2021-07-29 MED ORDER — NIFEDIPINE ER 30 MG PO TB24: 30.0000 mg | ORAL_TABLET | Freq: Two times a day (BID) | ORAL | 1 refills | Status: DC

## 2021-07-29 NOTE — Clinical Social Work Maternal (Signed)
CLINICAL SOCIAL WORK MATERNAL/CHILD NOTE  Patient Details  Name: Amy Harvey MRN: [id number] Date of Birth: [birth year]  Date:  07/29/2021  Clinical Social Worker Initiating Note:  Abundio Miu, [place] Date/Time: Initiated:  07/29/21/1100     Child's Name:  Amy Harvey   Biological Parents:  Mother, Father (Father: Maxima Skelton)   Need for Interpreter:  None   Reason for Referral:  Parental Support of Premature Babies < 32 weeks/or Critically Ill babies, Behavioral Health Concerns   Address:  [address] [place] [address]    Phone number:  [phone] (home)     Additional phone number:   Household Members/Support Persons (HM/SP):   Household Member/Support Person 1   HM/SP Name Relationship DOB or Age  HM/SP -1 Hilda Husband/FOB    HM/SP -2        HM/SP -3        HM/SP -4        HM/SP -5        HM/SP -6        HM/SP -7        HM/SP -8          Natural Supports (not living in the home):  Parent, Immediate Family   Professional Supports: None   Employment: Self-employed   Type of Work: Surveyor, minerals   Education:  Forensic psychologist   Homebound arranged:    Museum/gallery curator Resources:  Multimedia programmer    Other Resources:      Cultural/Religious Considerations Which May Impact Care:    Strengths:  Ability to meet basic needs  , Understanding of illness   Psychotropic Medications:         Pediatrician:       Pediatrician List:   [place]      Pediatrician Fax Number:    Risk Factors/Current Problems:  Mental Health Concerns     Cognitive State:  Alert  , Able to Concentrate  , Insightful  , Goal Oriented  , Linear Thinking     Mood/Affect:  Calm  , Interested  , Comfortable     CSW Assessment: CSW met with MOB at bedside to complete psychosocial assessment. CSW introduced self and explained role. MOB was welcoming, pleasant, and  remained engaged during assessment. 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 MOB shared that she planned on applying for [facility]. CSW agreed to complete [facility] referral, MOB agreeable. MOB reported that they have some items for infant including a car seat, crib, basinet and stroller. MOB shared that they have a baby shower planned on the 24th. CSW informed MOB about [facility] [employer] if any assistance is needed obtaining items for infant. MOB reported that assistance with diapers and wipes would be helpful, CSW agreed to make a referral. CSW inquired about MOB's support system, MOB reported that her mom and in-laws are supports.   CSW inquired about MOB's mental health history. MOB reported that she was diagnosed with anxiety and depression 2 years ago. MOB shared that it was triggered by the pandemic, losing her dad, planning a wedding and losing her job after getting married. MOB reported that she was on Venlafaxine but discontinued use with pregnancy. MOB denied any current symptoms of depression/anxiety, noting she did well during pregnancy without medication. MOB shared that she is not currently  participating in therapy and may return at a later time. CSW inquired about MOB's coping skills, MOB reported: walking her dogs and providing dog care. MOB denied any additional mental health history. CSW inquired about how MOB was feeling emotionally since giving birth, MOB reported that she has been weepy and super emotional due to infant's NICU admission. CSW acknowledged and validated MOB's feelings surrounding her experience. MOB presented calm and did not demonstrate any acute mental health signs/symptoms. CSW assessed for safety, MOB denied SI, HI, and domestic violence. CSW informed MOB that she may be more susceptible to postpartum depression due to her mental health history.   CSW provided education regarding the baby blues period vs. perinatal mood disorders,  discussed treatment and gave resources for mental health follow up if concerns arise.  CSW recommends self-evaluation during the postpartum time period using the New Mom Checklist from Postpartum Progress and encouraged MOB to contact a medical professional if symptoms are noted at any time.    CSW provided review of Sudden Infant Death Syndrome (SIDS) precautions.    CSW and MOB discussed infant's NICU admission. CSW informed MOB about the NICU, what to expect, and resources/supports available while infant is admitted to the NICU. MOB reported that infant's NICU admission has been going good and she feels well informed about infant's care. MOB denied any transportation barriers with visiting infant in the NICU aside from being unable to drive herself for 2 weeks. MOB reported that meal vouchers and gas cards would be helpful. CSW agreed to place meal vouchers and gas cards at infant's bedside. MOB denied any questions/concerns regarding the NICU. CSW informed MOB that infant qualifies to apply for SSI benefits, MOB reported that she is interested. CSW explained SSI benefits and application process. CSW provided SSI paperwork.   CSW will continue to offer resources/supports while infant is admitted to the NICU.   CSW placed 6 meal vouchers and 2 gas cards at infant's bedside.  CSW completed [facility] referral and referral to [facility] for requested items.    CSW Plan/Description:  Sudden Infant Death Syndrome (SIDS) Education, Perinatal Mood and Anxiety Disorder (PMADs) Education, Psychosocial Support and Ongoing Assessment of Needs, Supplemental Security Income (SSI) Information, Other Patient/Family Education, Other Information/Referral to [employer], [place] 07/29/2021, 1:55 PM

## 2021-07-29 NOTE — Lactation Note (Signed)
This note was copied from a baby's chart. Lactation Consultation Note Mother to d/c today. Her milk is in and she has no s/s of engorgement. Her milk volume is wnl today. Mother is aware of [facility] services in NICU. We reviewed IDF to begin when infant is ready.  Patient Name: Amy Harvey [id number] Date: 07/29/2021 Reason for consult: Follow-up assessment Age:30 days  Maternal Data  Pumping frequency: q 3-4 with 68m yield today  Feeding Mother's Current Feeding Choice: Breast Milk   Interventions Interventions: Education   Consult Status Consult Status: Follow-up Date: 07/29/21 Follow-up type: In-patient    LGwynne Edinger9/14/2022, 1:01 PM

## 2021-07-29 NOTE — Discharge Summary (Signed)
Postpartum Discharge Summary  Date of Service updated 07/29/21     Patient Name: Amy Harvey DOB: Jun 12, 1991 MRN: [id number]  Date of admission: 07/24/2021 Delivery date:07/24/2021  Delivering provider: Radene Gunning  Date of discharge: 07/29/2021  Admitting diagnosis: Delivery by classical cesarean section [O82] Intrauterine pregnancy: [redacted]w[redacted]d    Secondary diagnosis:  Active Problems:   Delivery by classical cesarean section  Additional problems: HELLP, placental abruption    Discharge diagnosis: Preterm Pregnancy Delivered and Preeclampsia (severe)                                              Post partum procedures: Augmentation: N/A Complications: Placental Abruption  Hospital course: Onset of Labor With Unplanned C/S   30y.o. yo G1P0101 at 30w1das admitted in pain on 07/24/2021. Patient had a course significant for abdominal pain. U/S noted placental abruption. The patient went for cesarean section due to  placental abruption . Delivery details as follows: Membrane Rupture Time/Date: 10:22 AM ,07/24/2021   Delivery Method:C-Section, Classical  Details of operation can be found in separate operative note. Patient had an postpartum course notable for HELLP. She was given postpartum magnesium sulfate. Also started on nifedipine XL 304mID. Platelets normalized. LFT remain in 100s-200s.  She is ambulating,tolerating a regular diet, passing flatus, and urinating well.  Patient is discharged home in stable condition 07/29/21.  Newborn Data: Birth date:07/24/2021  Birth time:10:22 AM  Gender:Female  Living status:Living  Apgars:4 ,8  Weight:840 g   Magnesium Sulfate received: Yes: Seizure prophylaxis BMZ received: Yes Rhophylac:Yes MMR:No T-DaP:Given prenatally Flu: No Transfusion:No  Physical exam  Vitals:   07/28/21 1954 07/28/21 2358 07/29/21 0516 07/29/21 0759  BP: (!) 146/93 (!) 151/42 129/90 130/90  Pulse: 78 63 68 61  Resp: 18 17 16 16   Temp: 98.2 F (36.8  C) 97.6 F (36.4 C) (!) 97.4 F (36.3 C) 98.2 F (36.8 C)  TempSrc: Oral Oral Oral Oral  SpO2: 100% 100% 100% 100%  Weight:      Height:       General: alert, cooperative, and no distress Lochia: appropriate Uterine Fundus: firm Incision: Healing well with no significant drainage DVT Evaluation: No evidence of DVT seen on physical exam. Labs: Lab Results  Component Value Date   WBC 7.2 07/29/2021   HGB 11.6 (L) 07/29/2021   HCT 33.6 (L) 07/29/2021   MCV 91.1 07/29/2021   PLT 124 (L) 07/29/2021   CMP Latest Ref Rng & Units 07/29/2021  Glucose 70 - 99 mg/dL 107(H)  BUN 6 - 20 mg/dL 9  Creatinine 0.44 - 1.00 mg/dL 0.71  Sodium 135 - 145 mmol/L 136  Potassium 3.5 - 5.1 mmol/L 3.8  Chloride 98 - 111 mmol/L 101  CO2 22 - 32 mmol/L 25  Calcium 8.9 - 10.3 mg/dL 9.1  Total Protein 6.5 - 8.1 g/dL 5.5(L)  Total Bilirubin 0.3 - 1.2 mg/dL 0.5  Alkaline Phos 38 - 126 U/L 102  AST 15 - 41 U/L 156(H)  ALT 0 - 44 U/L 224(H)   EdiFlavia Shipperore: Edinburgh Postnatal Depression Scale Screening Tool 07/24/2021  I have been able to laugh and see the funny side of things. (No Data)      After visit meds:  Allergies as of 07/29/2021   No Known Allergies      Medication List  STOP taking these medications    B-6 PO   doxylamine (Sleep) 25 MG tablet Commonly known as: UNISOM       TAKE these medications    acetaminophen 500 MG tablet Commonly known as: TYLENOL Take 2 tablets (1,000 mg total) by mouth every 6 (six) hours as needed.   ibuprofen 600 MG tablet Commonly known as: ADVIL Take 1 tablet (600 mg total) by mouth every 6 (six) hours as needed.   NIFEdipine 30 MG 24 hr tablet Commonly known as: ADALAT CC Take 1 tablet (30 mg total) by mouth every 12 (twelve) hours.   oxyCODONE 5 MG immediate release tablet Commonly known as: Oxy IR/ROXICODONE Take 1 tablet (5 mg total) by mouth every 6 (six) hours as needed for moderate pain.   prenatal multivitamin Tabs  tablet Take 1 tablet by mouth daily at 12 noon.               Durable Medical Equipment  (From admission, onward)           Start     Ordered   07/25/21 1309  For home use only DME double electric breast pump  Once       Comments: ICD-10  z39.1   07/25/21 1309             Discharge home in stable condition Infant Feeding:  Infant Disposition:NICU Discharge instruction: per After Visit Summary and Postpartum booklet. Activity: Advance as tolerated. Pelvic rest for 6 weeks.  Diet: routine diet Anticipated Birth Control: Unsure Postpartum Appointment:2-3 days Additional Postpartum F/U: BP check 2-3 days Future Appointments: Future Appointments  Date Time Provider [place]  08/04/2021  3:40 PM Lesleigh Noe, MD [facility]  09/21/2021  9:45 AM Ralene Bathe, MD [facility] None  03/22/2022  8:45 AM Ralene Bathe, MD [facility] None   Follow up Visit:      07/29/2021 Allena Katz, MD

## 2021-07-29 NOTE — Progress Notes (Signed)
Discharge instructions and prescriptions given to pt. Discussed post c-section care, signs and symptoms to report to the MD, upcoming appointments, and meds. Pt verbalizes understanding and has no questions or concerns at this time. Pt discharged home from hospital in stable condition. 

## 2021-07-31 ENCOUNTER — Ambulatory Visit: Payer: Self-pay

## 2021-07-31 NOTE — Lactation Note (Signed)
This note was copied from a baby's chart. Lactation Consultation Note  Patient Name: Amy Harvey [id number] Date: 07/31/2021 Reason for consult: Follow-up assessment;Mother's request;Primapara;1st time breastfeeding;NICU baby;Preterm <34wks;Infant < 6lbs Age:30 days  Ms. Elmore called for lactation. She was having difficulty expressing milk from her left breast. This began at her pumping session at 0600 today. Upon inspection, the white membrane of her pump part was pushed into the incorrect hole, which explains why there was little suction on that side. She uses the same pump kit for her Symphony and P.I.S. Once we fixed the issue, she expressed well from this side.  I reviewed pumping basics. She is pumping strong volumes today. Her breasts are full but soft.  I set up a belly band for hands free pumping. I recommended that she pump both breasts for 15-20 minutes 8 times a day.  Maternal Data Does the patient have breastfeeding experience prior to this delivery?: No  Feeding Mother's Current Feeding Choice: Breast Milk   Lactation Tools Discussed/Used Tools: Pump;Flanges Flange Size: 24 (Rec changing to 27 if nipples become sore) Breast pump type: Double-Electric Breast Pump Pump Education: Setup, frequency, and cleaning;Milk Storage Reason for Pumping: NICU Pumping frequency: recommended q3 hours Pumped volume: 180 mL  Interventions Interventions: Breast feeding basics reviewed;DEBP;Education (belly band)  Discharge Discharge Education: Engorgement and breast care Pump: DEBP  Consult Status Consult Status: Follow-up Date: 07/31/21 Follow-up type: In-patient    Lenore Manner 07/31/2021, 11:44 AM

## 2021-08-03 ENCOUNTER — Ambulatory Visit: Payer: 59

## 2021-08-04 ENCOUNTER — Encounter: Payer: Self-pay | Admitting: Family Medicine

## 2021-08-04 ENCOUNTER — Other Ambulatory Visit: Payer: Self-pay

## 2021-08-04 ENCOUNTER — Ambulatory Visit (INDEPENDENT_AMBULATORY_CARE_PROVIDER_SITE_OTHER): Payer: 59 | Admitting: Family Medicine

## 2021-08-04 VITALS — BP 112/80 | HR 77 | Temp 97.0°F | Ht 67.0 in | Wt 185.4 lb

## 2021-08-04 DIAGNOSIS — Z0000 Encounter for general adult medical examination without abnormal findings: Secondary | ICD-10-CM

## 2021-08-04 DIAGNOSIS — Z23 Encounter for immunization: Secondary | ICD-10-CM

## 2021-08-04 NOTE — Patient Instructions (Signed)
Preventive Care 21-30 Years Old, Female Preventive care refers to lifestyle choices and visits with your health care provider that can promote health and wellness. This includes: A yearly physical exam. This is also called an annual wellness visit. Regular dental and eye exams. Immunizations. Screening for certain conditions. Healthy lifestyle choices, such as: Eating a healthy diet. Getting regular exercise. Not using drugs or products that contain nicotine and tobacco. Limiting alcohol use. What can I expect for my preventive care visit? Physical exam Your health care provider may check your: Height and weight. These may be used to calculate your BMI (body mass index). BMI is a measurement that tells if you are at a healthy weight. Heart rate and blood pressure. Body temperature. Skin for abnormal spots. Counseling Your health care provider may ask you questions about your: Past medical problems. Family's medical history. Alcohol, tobacco, and drug use. Emotional well-being. Home life and relationship well-being. Sexual activity. Diet, exercise, and sleep habits. Work and work environment. Access to firearms. Method of birth control. Menstrual cycle. Pregnancy history. What immunizations do I need? Vaccines are usually given at various ages, according to a schedule. Your health care provider will recommend vaccines for you based on your age, medical history, and lifestyle or other factors, such as travel or where you work. What tests do I need? Blood tests Lipid and cholesterol levels. These may be checked every 5 years starting at age 20. Hepatitis C test. Hepatitis B test. Screening Diabetes screening. This is done by checking your blood sugar (glucose) after you have not eaten for a while (fasting). STD (sexually transmitted disease) testing, if you are at risk. BRCA-related cancer screening. This may be done if you have a family history of breast, ovarian, tubal, or  peritoneal cancers. Pelvic exam and Pap test. This may be done every 3 years starting at age 21. Starting at age 30, this may be done every 5 years if you have a Pap test in combination with an HPV test. Talk with your health care provider about your test results, treatment options, and if necessary, the need for more tests. Follow these instructions at home: Eating and drinking  Eat a healthy diet that includes fresh fruits and vegetables, whole grains, lean protein, and low-fat dairy products. Take vitamin and mineral supplements as recommended by your health care provider. Do not drink alcohol if: Your health care provider tells you not to drink. You are pregnant, may be pregnant, or are planning to become pregnant. If you drink alcohol: Limit how much you have to 0-1 drink a day. Be aware of how much alcohol is in your drink. In the [place] one drink equals one 12 oz bottle of beer (355 mL), one 5 oz glass of wine (148 mL), or one 1 oz glass of hard liquor (44 mL). Lifestyle Take daily care of your teeth and gums. Brush your teeth every morning and night with fluoride toothpaste. Floss one time each day. Stay active. Exercise for at least 30 minutes 5 or more days each week. Do not use any products that contain nicotine or tobacco, such as cigarettes, e-cigarettes, and chewing tobacco. If you need help quitting, ask your health care provider. Do not use drugs. If you are sexually active, practice safe sex. Use a condom or other form of protection to prevent STIs (sexually transmitted infections). If you do not wish to become pregnant, use a form of birth control. If you plan to become pregnant, see your health care provider   for a prepregnancy visit. Find healthy ways to cope with stress, such as: Meditation, yoga, or listening to music. Journaling. Talking to a trusted person. Spending time with friends and family. Safety Always wear your seat belt while driving or riding in a  vehicle. Do not drive: If you have been drinking alcohol. Do not ride with someone who has been drinking. When you are tired or distracted. While texting. Wear a helmet and other protective equipment during sports activities. If you have firearms in your house, make sure you follow all gun safety procedures. Seek help if you have been physically or sexually abused. What's next? Go to your health care provider once a year for an annual wellness visit. Ask your health care provider how often you should have your eyes and teeth checked. Stay up to date on all vaccines. This information is not intended to replace advice given to you by your health care provider. Make sure you discuss any questions you have with your health care provider. Document Revised: 01/09/2021 Document Reviewed: 07/13/2018 Elsevier Patient Education  2022 Elsevier Inc.  

## 2021-08-04 NOTE — Progress Notes (Signed)
Subjective:     Amy Harvey is a 30 y.o. female presenting for Establish Care     HPI  #post-partum - complicated by abruption and HELLP syndrome - pain is improved - has OB/GYN appointment this week - is on nifedipine for bp - does not check at home  Mood overall good Baby is doing well   Review of Systems  Constitutional:  Negative for chills and fever.  HENT:  Negative for congestion and sore throat.   Respiratory:  Negative for shortness of breath.   Cardiovascular:  Negative for chest pain.  Gastrointestinal:  Positive for abdominal pain (improving, healing from surgery). Negative for nausea and vomiting.  Genitourinary: Negative.   Musculoskeletal: Negative.  Negative for myalgias.  Skin:  Negative for rash.  Neurological:  Negative for dizziness and headaches.  Hematological:  Does not bruise/bleed easily.  Psychiatric/Behavioral:  The patient is not nervous/anxious.     Social History   Tobacco Use  Smoking Status Never  Smokeless Tobacco Never    Reviewed and updated problem list, family hx, pmh, and social hx    Objective:    BP Readings from Last 3 Encounters:  08/04/21 112/80  07/29/21 130/90  07/21/21 121/78   Wt Readings from Last 3 Encounters:  08/04/21 185 lb 6 oz (84.1 kg)  07/24/21 202 lb (91.6 kg)  07/16/21 197 lb 12.8 oz (89.7 kg)    BP 112/80   Pulse 77   Temp (!) 97 F (36.1 C) (Temporal)   Ht 5\' 7"  (1.702 m)   Wt 185 lb 6 oz (84.1 kg)   LMP 01/01/2021   SpO2 98%   Breastfeeding Yes   BMI 29.03 kg/m    Physical Exam Constitutional:      General: She is not in acute distress.    Appearance: She is well-developed. She is not diaphoretic.  HENT:     Head: Normocephalic and atraumatic.     Right Ear: External ear normal.     Left Ear: External ear normal.     Nose: Nose normal.  Eyes:     General: No scleral icterus.    Extraocular Movements: Extraocular movements intact.     Conjunctiva/sclera: Conjunctivae  normal.  Cardiovascular:     Rate and Rhythm: Normal rate and regular rhythm.     Heart sounds: No murmur heard. Pulmonary:     Effort: Pulmonary effort is normal. No respiratory distress.     Breath sounds: Normal breath sounds. No wheezing.  Abdominal:     Comments: Exam deferred as seeing OB/GYN and recent c-section  Musculoskeletal:        General: Normal range of motion.     Cervical back: Neck supple.  Lymphadenopathy:     Cervical: No cervical adenopathy.  Skin:    General: Skin is warm and dry.     Capillary Refill: Capillary refill takes less than 2 seconds.  Neurological:     Mental Status: She is alert and oriented to person, place, and time.     Deep Tendon Reflexes: Reflexes normal.  Psychiatric:        Mood and Affect: Mood normal.        Behavior: Behavior normal.          Assessment & Plan:   Problem List Items Addressed This Visit   None Visit Diagnoses     Annual physical exam    -  Primary   Need for influenza vaccination  Relevant Orders   Flu Vaccine QUAD 17mo+IM (Fluarix, Fluzone & Alfiuria Quad PF) (Completed)      Healthy Will obtain GYN records for pap smear, labs and tdap Encouraged regular exercise as tolerated and healthy diet Return sooner if concerns  Return in about 1 year (around 08/04/2022).  Lesleigh Noe, MD  This visit occurred during the SARS-CoV-2 public health emergency.  Safety protocols were in place, including screening questions prior to the visit, additional usage of staff PPE, and extensive cleaning of exam room while observing appropriate contact time as indicated for disinfecting solutions.

## 2021-08-06 ENCOUNTER — Ambulatory Visit: Payer: Self-pay

## 2021-08-06 ENCOUNTER — Telehealth (HOSPITAL_COMMUNITY): Payer: Self-pay | Admitting: *Deleted

## 2021-08-06 NOTE — Telephone Encounter (Signed)
Mom reports feeling a little sore, but incision is healing well. No concerns physically. Has noticed anxiety since birth. She is not taking any meds for anxiety at this time. She has not had appt for therapist, but will talk to her OB about it tomorrow at recheck appt. Dr. Royston Sinner faxed info. Mom reports baby girl is still in the NICU.  Odis Hollingshead, RN 08-06-2021 at 11:00am

## 2021-08-06 NOTE — Lactation Note (Signed)
This note was copied from a baby's chart. Lactation Consultation Note  Patient Name: Amy Harvey [id number] Date: 08/06/2021 Reason for consult: Follow-up assessment;Primapara;1st time breastfeeding;NICU baby;Preterm <34wks Age:30 days  Lactation consult follow up with Ms. Christell Constant and her 70 week old daughter, [name]. Ms. Thornsberry is at risk for insufficient milk production. I educated on her pumping volume goals for baby when she is older and able to take larger volumes. At this time, her production is sufficient for baby's intake needs. I recommended increasing frequency to every three hours or 8 times a day. I advised that it is normal to pump more milk than her pre-term baby will need, but the goal will be to protect her milk as baby's intake needs increase. I provided education on milk storage.  Maternal Data Does the patient have breastfeeding experience prior to this delivery?: No  Feeding Mother's Current Feeding Choice: Breast Milk  Lactation Tools Discussed/Used Breast pump type: Double-Electric Breast Pump (Pump in Style) Pump Education: Setup, frequency, and cleaning;Milk Storage Reason for Pumping: NICU Pumping frequency: q4 Pumped volume: 60 mL  Interventions Interventions: Education  Discharge Pump: DEBP;Personal  Consult Status Consult Status: Follow-up Date: 08/06/21 Follow-up type: In-patient    Lenore Manner 08/06/2021, 11:13 AM

## 2021-08-10 ENCOUNTER — Ambulatory Visit: Payer: 59

## 2021-08-18 ENCOUNTER — Ambulatory Visit: Payer: Self-pay

## 2021-08-18 NOTE — Lactation Note (Signed)
This note was copied from a baby's chart. Lactation Consultation Note  Patient Name: Amy Harvey [id number] Date: 08/18/2021 Reason for consult: 1st time breastfeeding;Primapara;NICU baby;Infant < 6lbs;Preterm <34wks;Weekly NICU follow-up Age:30 wk.o.  Visited with mom of 64 79/70 weeks old NICU female, she's a P1 and reports full onset of lactogenesis III. However, she's not pumping enough because she doesn't want to dump it due to the lack of space in her fridge/freezer.  LC spoke to the milk lab (they had asked her to take home some of her milk) to see if they can hold her milk until Friday when her new freezer will be delivered, they said, they will, mom was very appreciative.   Reviewed lactogenesis III, supply/demand, pumping schedule and benefits of STS care.  Plan of care:   Encouraged mom to continue pumping consistently every 3 hours, at least 8 pumping sessions/24 hours She'll let NICU LC know when baby is ready for some "breast practice" @ the empty breast   No other support person at this time. All questions and concerns answered, mom to call NICU LC PRN.  Maternal Data   Mom's supply has increased but it's due to her spacing her pumping sessions. She's having challenges with no having more room in her house to store her breastmilk, she ordered a deep freezer and will be delivered on Friday, October 7th.   Feeding Mother's Current Feeding Choice: Breast Milk  Lactation Tools Discussed/Used Tools: Pump;Flanges Flange Size: 24 Breast pump type: Double-Electric Breast Pump Pump Education: Setup, frequency, and cleaning;Milk Storage Reason for Pumping: pre-term infant in NICU Pumping frequency: 5 times/24 hours Pumped volume: 90 mL (90-120 ml)  Discharge Pump: DEBP;Personal  Consult Status Consult Status: Follow-up Date: 08/18/21 Follow-up type: In-patient   Amy Harvey 08/18/2021, 10:56 AM

## 2021-08-28 ENCOUNTER — Ambulatory Visit: Payer: Self-pay

## 2021-08-28 NOTE — Lactation Note (Signed)
This note was copied from a baby's chart.  NICU Lactation Consultation Note  Patient Name: Amy Harvey [id number] Date: 08/28/2021 Age:30 wk.o.   Subjective Reason for consult: Weekly NICU follow-up Mother recalls she is pumping 6-7 x day without difficulty. She shared that she purchased a deep freezer to hold the excess milk until [name] is ready for it.   Objective Infant data: Mother's Current Feeding Choice: Breast Milk  Infant feeding assessment Scale for Readiness: 3  Maternal data: G1P0101  C-Section, Classical  Pumping frequency: 7xday Pumped volume: 150 mL   Assessment Maternal: Mother is maintaining a normal milk supply with 6-7 pumpings per day. Milk volume: Normal   Intervention/Plan Interventions: Education  Plan: Consult Status: Follow-up  NICU Follow-up type: Weekly NICU follow up   Gwynne Edinger 08/28/2021, 4:14 PM

## 2021-09-04 ENCOUNTER — Ambulatory Visit: Payer: Self-pay

## 2021-09-04 NOTE — Lactation Note (Signed)
This note was copied from a baby's chart.  NICU Lactation Consultation Note  Patient Name: Amy Harvey [id number] Date: 09/04/2021 Age:30 wk.o.   Subjective Reason for consult: NICU baby; Follow-up assessment Mother continues to pump frequently and without difficulty. She notices that baby has early signs of breastfeeding interest. She would like to bf when baby is ready.   Objective Infant data: Mother's Current Feeding Choice: Breast Milk  Infant feeding assessment Scale for Readiness: 3   Maternal data: G1P0101  C-Section, Classical Pumping frequency: 120-240 ml per pumping (7xday)  Assessment Infant:  Maternal: Mother has normal to abundant milk supply.   Intervention/Plan Interventions: Education  Plan: Consult Status: Follow-up  NICU Follow-up type: Weekly NICU follow up  Mother to continue pumping frequently. She can continue taking a 5-6 hour break to sleep.  LC will return to assist with bf when baby is ready.  Gwynne Edinger 09/04/2021, 1:13 PM

## 2021-09-13 ENCOUNTER — Ambulatory Visit: Payer: Self-pay

## 2021-09-13 NOTE — Lactation Note (Signed)
This note was copied from a baby's chart. Lactation Consultation Note  Patient Name: Girl Audreena Sachdeva [id number] Date: 09/13/2021 Reason for consult: Follow-up assessment;NICU baby;Late-preterm 34-36.6wks;Primapara;1st time breastfeeding;Infant < 6lbs Age:30 wk.o.  Visited with mom of 30 8/22 weeks old (adjusted) NICU female, she's a P1 and continues to pump but has decreased her pumping sessions, she still has a big pile of milk on her new freezer and continues to stack her "stash" at home, the hospital has no asked for to bring more milk yet, she was told we still have breastmilk for baby.  Baby has been on continuous feedings until today, she was switched to scheduled ones. Mom is very excited about taking baby to breast, she's ready to do some lick and learn next week. Reviewed pumping schedule, expectations when not pumping every 3 hours, supply/demand and benefits of STS care. LC also provided parents with extra storage bottles and labels.  Plan of care:   Encouraged mom to continue pumping consistently every 3 hours, at least 8 pumping sessions/24 hours; but she might continue going at her own pace if that's within her goals She'll see NICU LC again next week for some lick & learn   FOB present and supportive. All questions and concerns answered, mom to call NICU LC PRN.  Maternal Data  Mom's supply has slightly decreased due to less pumping but it's still WNL.   Feeding Mother's Current Feeding Choice: Breast Milk  Lactation Tools Discussed/Used Tools: Pump;Flanges Flange Size: 24 Breast pump type: Double-Electric Breast Pump Pump Education: Setup, frequency, and cleaning;Milk Storage Reason for Pumping: LPI in NICU Pumping frequency: 6 times/24 hours Pumped volume: 120 mL (up to 180 ml in the morning)  Interventions Interventions: DEBP;Education;Infant Driven Feeding Algorithm education  Discharge Pump: DEBP;Personal  Consult Status Consult Status: Follow-up Date:  09/13/21 Follow-up type: In-patient   Neda Willenbring Francene Boyers 09/13/2021, 6:24 PM

## 2021-09-15 ENCOUNTER — Ambulatory Visit: Payer: Self-pay

## 2021-09-15 NOTE — Lactation Note (Signed)
This note was copied from a baby's chart. Lactation Consultation Note  Patient Name: Amy Harvey RKYHC'W Date: 09/15/2021 Reason for consult: Follow-up assessment;NICU baby;Late-preterm 34-36.6wks;Primapara;1st time breastfeeding;Infant < 6lbs Age:30 wk.o.  Attempt to visit with mom for the 11 am feeding but she was running late and just had started pumping. LC returned later after she was done pumping to take baby to breast for the first time.  LC undressed baby STS and brought her to mom in cross cradle position, she would do short bursts of NNS on and off taking little breaks. LC assisted with repositioning baby multiple times every time she came off the breast. This feeding/attempt was stopped at the 10 minutes mark due to baby's increased RR. Mom and baby doing STS when exiting the room.  Reviewed normal LPI behavior, expectations, feeding cues and IDF 1 with mom. She's aware that she needs to pump her breast prior taking baby to breast until cleared by SLPs.  Plan of care:   Encouraged mom to continue pumping consistently every 3 hours, at least 8 pumping sessions/24 hours; but she might continue going at her own pace if that's within her goals She'll start taking baby to a pumped breast on feeding cues   No other support person at this time. All questions and concerns answered, mom to call NICU LC PRN.  Maternal Data   Mom's supply is still WNL  Feeding Mother's Current Feeding Choice: Breast Milk  LATCH Score Latch: Repeated attempts needed to sustain latch, nipple held in mouth throughout feeding, stimulation needed to elicit sucking reflex.  Audible Swallowing: None  Type of Nipple: Everted at rest and after stimulation  Comfort (Breast/Nipple): Soft / non-tender  Hold (Positioning): Assistance needed to correctly position infant at breast and maintain latch.  LATCH Score: 6  Lactation Tools Discussed/Used Tools: Pump;Flanges Flange Size: 24 Breast pump  type: Double-Electric Breast Pump Pump Education: Setup, frequency, and cleaning;Milk Storage Reason for Pumping: LPI in NICU Pumping frequency: 6 times/24 hours Pumped volume: 120 mL (up to 180 ml in the AM)  Interventions Interventions: Breast feeding basics reviewed;Assisted with latch;Skin to skin;Breast massage;Hand express;Breast compression;Adjust position;Support pillows;DEBP;Education  Discharge Pump: DEBP;Personal  Consult Status Consult Status: Follow-up Date: 09/15/21 Follow-up type: In-patient   Amy Harvey Amy Harvey 09/15/2021, 12:59 PM

## 2021-09-21 ENCOUNTER — Ambulatory Visit (INDEPENDENT_AMBULATORY_CARE_PROVIDER_SITE_OTHER): Payer: 59 | Admitting: Dermatology

## 2021-09-21 ENCOUNTER — Other Ambulatory Visit: Payer: Self-pay

## 2021-09-21 ENCOUNTER — Encounter: Payer: Self-pay | Admitting: Dermatology

## 2021-09-21 DIAGNOSIS — D225 Melanocytic nevi of trunk: Secondary | ICD-10-CM | POA: Diagnosis not present

## 2021-09-21 DIAGNOSIS — Z86018 Personal history of other benign neoplasm: Secondary | ICD-10-CM | POA: Diagnosis not present

## 2021-09-21 DIAGNOSIS — D2221 Melanocytic nevi of right ear and external auricular canal: Secondary | ICD-10-CM

## 2021-09-21 DIAGNOSIS — D229 Melanocytic nevi, unspecified: Secondary | ICD-10-CM

## 2021-09-21 NOTE — Patient Instructions (Signed)

## 2021-09-21 NOTE — Progress Notes (Signed)
   Follow-Up Visit   Subjective  Amy Harvey is a 30 y.o. female who presents for the following: Follow-up (Patient here today for follow up on biopsy at left ear. Patient reports a new mole at right breast she would like checked. ).  The following portions of the chart were reviewed this encounter and updated as appropriate:  Tobacco  Allergies  Meds  Problems  Med Hx  Surg Hx  Fam Hx     Review of Systems: No other skin or systemic complaints except as noted in HPI or Assessment and Plan.  Objective  Well appearing patient in no apparent distress; mood and affect are within normal limits.  A focused examination was performed including left ear, right ear, right breast. Relevant physical exam findings are noted in the Assessment and Plan.  Right Superior Helix 2 mm brown macule   Right Breast 2 mm  slightly irregular brown macule            Assessment & Plan  Nevus (2) Right Breast; Right Superior Helix See photo Benign-appearing.  Observation.  Call clinic for new or changing moles.  Recommend daily use of broad spectrum spf 30+ sunscreen to sun-exposed areas.   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  - Call if any new or changing lesions are noted between office visits  Return for follow up as schedule at 03/22/2022. IRuthell Rummage, CMA, am acting as scribe for Sarina Ser, MD. Documentation: I have reviewed the above documentation for accuracy and completeness, and I agree with the above.  Sarina Ser, MD

## 2021-09-23 ENCOUNTER — Ambulatory Visit: Payer: Self-pay

## 2021-09-23 NOTE — Lactation Note (Signed)
This note was copied from a baby's chart. Lactation Consultation Note  Patient Name: Amy Harvey RKYHC'W Date: 09/23/2021 Reason for consult: Follow-up assessment;1st time breastfeeding;Primapara;NICU baby;Infant < 6lbs;Early term 37-38.6wks Age:30 m.o.  Visited with mom of 40 60/79 weeks old (adjusted) NICU female, she's a P1 and had some questions regarding baby care. Reviewed STS care, burping, positioning and how to hold baby when feeding tube is running.  Mom is very excited that baby "[name]" is now taking the bottle, she voiced she's going to be working on bottle feedings for now but that she'll call for latch assistance the next time she's ready to take baby to breast; she'd like to do both once she takes baby home, breastfeeding and bottle feeding as well.  Maternal Data  Mom's supply is still WNL  Feeding Mother's Current Feeding Choice: Breast Milk Nipple Type: Nfant Extra Slow Flow (gold)  Lactation Tools Discussed/Used Tools: Pump;Flanges Flange Size: 24 Breast pump type: Double-Electric Breast Pump Pump Education: Setup, frequency, and cleaning;Milk Storage Reason for Pumping: ETI in NICU Pumping frequency: 6-7 times/24 hours Pumped volume: 120 mL  Interventions Interventions: Breast feeding basics reviewed;DEBP;Education  Plan of care  Encouraged mom to continue pumping consistently every 3 hours; at least 8 times in 24 hours She'll call for assistance when she's ready to put baby to breast  No other support person at this time. All questions and concerns answered, mom to call NICU LC PRN.  Discharge Pump: DEBP;Personal  Consult Status Consult Status: Follow-up Date: 09/23/21 Follow-up type: In-patient   Amy Harvey 09/23/2021, 4:26 PM

## 2021-09-25 ENCOUNTER — Encounter: Payer: Self-pay | Admitting: Family Medicine

## 2021-09-27 ENCOUNTER — Ambulatory Visit: Payer: Self-pay

## 2021-09-27 NOTE — Lactation Note (Signed)
This note was copied from a baby's chart. Lactation Consultation Note  Patient Name: Girl Tashe Purdon [id number]'Z Date: 09/27/2021 Reason for consult: Follow-up assessment;1st time breastfeeding;Primapara;NICU baby;Infant < 6lbs;Early term 37-38.6wks;Mother's request Age:30 years  NICU RN Tiffany requested to visit with mom because she had some questions, she voiced she's ready to take baby to breast next week and requested a feeding assist for Monday 09/28/21 @ 11 am.  Mom has been working with baby "[name]" on bottle feedings and voiced that baby has been doing well with the bottle but she's now ready to start working with breastfeeding; since baby has been having some progress with the bottle.  Reviewed feeding cues, IDF 1/2, pumping schedule, and supply/demand.   Plan of care   Encouraged mom to continue pumping consistently every 3 hours; at least 8 times in 24 hours She'll F/U with NICU LC for feeding assist on 09/28/21    No other support person at this time. All questions and concerns answered, mom to call NICU LC PRN.   Maternal Data  Mom's supply is WNL  Feeding Mother's Current Feeding Choice: Breast Milk Nipple Type: Dr. Myra Gianotti Preemie  Lactation Tools Discussed/Used Tools: Pump;Flanges Flange Size: 24 Breast pump type: Double-Electric Breast Pump Pump Education: Setup, frequency, and cleaning;Milk Storage Reason for Pumping: ETI in NICU Pumping frequency: 6-7 times/24 hours Pumped volume: 90 mL (90-120 ml up to 210 in the AM)  Interventions Interventions: Breast feeding basics reviewed;DEBP;Education;Infant Driven Feeding Algorithm education  Discharge Pump: DEBP;Personal  Consult Status Consult Status: Follow-up Date: 09/27/21 Follow-up type: In-patient   Tychelle Purkey Francene Boyers 09/27/2021, 4:02 PM

## 2021-09-28 ENCOUNTER — Ambulatory Visit: Payer: Self-pay

## 2021-09-28 NOTE — Lactation Note (Signed)
This note was copied from a baby's chart. Lactation Consultation Note  Patient Name: Amy Harvey ACZYS'A Date: 09/28/2021 Reason for consult: Follow-up assessment;1st time breastfeeding;Preterm <34wks;NICU baby Age:30 m.o.  I followed up with Ms. Para and her daughter [name]. I assisted with latching [name] in football hold to the left breast. Baby needed some support at the breast initially (breast compression and hand expression), and then was able to maintain her latch on her own. Rhythmic suckling sequences noted and swallows noted. Baby began to tire after about 2-3 minutes at the breast. She also received her gavage feeding simultaneously.   We discussed the positives of this experience; baby did well overall, and she did not require a nipple shield at this consult, although we did discuss the possibility of using one in the future.  Maternal Data Has patient been taught Hand Expression?: Yes  Feeding Mother's Current Feeding Choice: Breast Milk Nipple Type: (P) Dr. Myra Gianotti Preemie  LATCH Score Latch: Repeated attempts needed to sustain latch, nipple held in mouth throughout feeding, stimulation needed to elicit sucking reflex.  Audible Swallowing: A few with stimulation  Type of Nipple: Everted at rest and after stimulation  Comfort (Breast/Nipple): Soft / non-tender  Hold (Positioning): Assistance needed to correctly position infant at breast and maintain latch.  LATCH Score: 7   Lactation Tools Discussed/Used Breast pump type: Double-Electric Breast Pump Pump Education: Setup, frequency, and cleaning Reason for Pumping: NICU; pre pump  Interventions Interventions: Breast feeding basics reviewed;Assisted with latch;Hand express;Breast compression;Adjust position;Support pillows;Education  Discharge Pump: DEBP  Consult Status Consult Status: Follow-up Date: 09/28/21 Follow-up type: In-patient    Lenore Manner 09/28/2021, 12:06 PM

## 2021-10-06 ENCOUNTER — Ambulatory Visit: Payer: Self-pay

## 2021-10-06 NOTE — Lactation Note (Signed)
This note was copied from a baby's chart. Lactation Consultation Note  Patient Name: Amy Harvey KGYJE'H Date: 10/06/2021 Reason for consult: Follow-up assessment;Primapara;1st time breastfeeding;NICU baby;Preterm <34wks Age:30 m.o.  Lactation followed up with Ms. Coutts and baby [name]. Baby has had an ad lib trial. Mom states that [name] will participate in a swallow study today. As baby is showing some improvement and approaching a discharge date, we discussed her long-term feeding plans and support resources. Ms. Sytsma is primarily bottle feeding at this time, but plans to breast feed more upon discharge. She states that she would like [name] to "be successful at both." Given that, I recommended that she take advantage of lactation services while still inpatient to practice her latching skills and build confidence. She agreed and we made an appointment for tomorrow at 1500 approximately. She will page lactation with a more firm feeding estimate on Wednesday.  I also recommended that she follow up in OP with lactation. I provided our [facility] services brochure, and she agreed to a lactation consult OP referral.   She lives in [place], and will have a pediatrician in [place].  I provided our NICU lactation contact information and encouraged her to call as needed, even after discharge.   Ms. Treanor continues to pump strong volumes, but she admits that she can be inconsistent at times, particularly at night. I recommended that she increase her pumping frequency if she notices any decline in her milk volume. Ms. Navarrete verbalized understanding.   Maternal Data Does the patient have breastfeeding experience prior to this delivery?: No  Feeding Mother's Current Feeding Choice: Breast Milk Nipple Type: Dr. Myra Gianotti Preemie  Lactation Tools Discussed/Used Breast pump type: Double-Electric Breast Pump Pump Education: Setup, frequency, and cleaning Reason for Pumping:  NICU Pumping frequency: q3 hours during day; up to 6 hours at night Pumped volume: 150 mL (4-6 ounces each session)  Interventions Interventions: Breast feeding basics reviewed;Education;LC Services brochure  Discharge Discharge Education: Outpatient recommendation;Outpatient Epic message sent Pump: DEBP  Consult Status Consult Status: Follow-up Date: 10/06/21 Follow-up type: In-patient    Lenore Manner 10/06/2021, 1:58 PM

## 2021-10-07 ENCOUNTER — Ambulatory Visit: Payer: Self-pay

## 2021-10-07 NOTE — Lactation Note (Signed)
This note was copied from a baby's chart.  NICU Lactation Consultation Note  Patient Name: Amy Harvey [id number] Date: 10/07/2021 Age:30 m.o.   Subjective Reason for consult: Breastfeeding assistance Mom has been mostly bottle feeding but would like to breastfeed. LC assisted with positioning and observed that baby's latch improved with the use of a nipple shield. Baby bf for 13 minutes with normal suck/swallow pattern.   Objective Infant data: Mother's Current Feeding Choice: Breast Milk  Infant feeding assessment Scale for Readiness: 1 Scale for Quality: 3   Maternal data: G1P0101  C-Section, Classical  Does the patient have breastfeeding experience prior to this delivery?: No   Pumping frequency: q3 hours during day; up to 6 hours at night Pumped volume: 150 mL (4-6 ounces each session)   Pump: DEBP   Assessment Infant: Baby was effective at breast with normal suck/swallow pattern and audible swallows.   LATCH Score: 9  Feeding Status: Ad lib   Maternal: Milk volume: Normal   Intervention/Plan Interventions: Support pillows; Breast feeding basics reviewed; Position options; Assisted with latch  Tools: Nipple [employer]  Plan: Consult Status: Follow-up  NICU Follow-up type: Assist with IDF-2 (Mother does not need to pre-pump before breastfeeding); Weekly NICU follow up    Gwynne Edinger 10/07/2021, 3:33 PM

## 2022-03-22 ENCOUNTER — Encounter: Payer: Self-pay | Admitting: Dermatology

## 2022-03-22 ENCOUNTER — Ambulatory Visit: Payer: 59 | Admitting: Dermatology

## 2022-03-22 DIAGNOSIS — D2221 Melanocytic nevi of right ear and external auricular canal: Secondary | ICD-10-CM | POA: Diagnosis not present

## 2022-03-22 DIAGNOSIS — L814 Other melanin hyperpigmentation: Secondary | ICD-10-CM

## 2022-03-22 DIAGNOSIS — L578 Other skin changes due to chronic exposure to nonionizing radiation: Secondary | ICD-10-CM | POA: Diagnosis not present

## 2022-03-22 DIAGNOSIS — D229 Melanocytic nevi, unspecified: Secondary | ICD-10-CM

## 2022-03-22 DIAGNOSIS — Z1283 Encounter for screening for malignant neoplasm of skin: Secondary | ICD-10-CM

## 2022-03-22 DIAGNOSIS — L821 Other seborrheic keratosis: Secondary | ICD-10-CM

## 2022-03-22 DIAGNOSIS — Z86018 Personal history of other benign neoplasm: Secondary | ICD-10-CM | POA: Diagnosis not present

## 2022-03-22 DIAGNOSIS — D1800 Hemangioma unspecified site: Secondary | ICD-10-CM

## 2022-03-22 NOTE — Progress Notes (Signed)
   Follow-Up Visit   Subjective  Amy Harvey is a 31 y.o. female who presents for the following: Annual Exam (Skin cancer screening. Full body. Hx of multiple dysplastic nevi. Concerned with areas on face). The patient presents for Total-Body Skin Exam (TBSE) for skin cancer screening and mole check.  The patient has spots, moles and lesions to be evaluated, some may be new or changing and the patient has concerns that these could be cancer.  The following portions of the chart were reviewed this encounter and updated as appropriate:  Tobacco  Allergies  Meds  Problems  Med Hx  Surg Hx  Fam Hx     Review of Systems: No other skin or systemic complaints except as noted in HPI or Assessment and Plan.  Objective  Well appearing patient in no apparent distress; mood and affect are within normal limits.  A full examination was performed including scalp, head, eyes, ears, nose, lips, neck, chest, axillae, abdomen, back, buttocks, bilateral upper extremities, bilateral lower extremities, hands, feet, fingers, toes, fingernails, and toenails. All findings within normal limits unless otherwise noted below.  Right Superior Helix Light brown regular macule        Assessment & Plan   History of Dysplastic Nevi. Multiple sites, see history. Left ear sup helix, severe, margins free, 03/16/2021. Clear on recheck. - No evidence of recurrence today - Recommend regular full body skin exams - Recommend daily broad spectrum sunscreen SPF 30+ to sun-exposed areas, reapply every 2 hours as needed.  - Call if any new or changing lesions are noted between office visits   Nevus Right Superior Helix See photo Benign-appearing.  Observation.  Call clinic for new or changing lesions.  Recommend daily use of broad spectrum spf 30+ sunscreen to sun-exposed areas.   Skin cancer screening  Lentigines - Scattered tan macules - Due to sun exposure - Benign-appearing, observe - Recommend daily  broad spectrum sunscreen SPF 30+ to sun-exposed areas, reapply every 2 hours as needed. - Call for any changes  Melanocytic Nevi - Tan-brown and/or pink-flesh-colored symmetric macules and papules - Benign appearing on exam today - Observation - Call clinic for new or changing moles - Recommend daily use of broad spectrum spf 30+ sunscreen to sun-exposed areas.   Hemangiomas - Red papules - Discussed benign nature - Observe - Call for any changes  Actinic Damage - Chronic condition, secondary to cumulative UV/sun exposure - diffuse scaly erythematous macules with underlying dyspigmentation - Recommend daily broad spectrum sunscreen SPF 30+ to sun-exposed areas, reapply every 2 hours as needed.  - Staying in the shade or wearing long sleeves, sun glasses (UVA+UVB protection) and wide brim hats (4-inch brim around the entire circumference of the hat) are also recommended for sun protection.  - Call for new or changing lesions.  Skin cancer screening performed today.  Return in about 1 year (around 03/23/2023) for TBSE, HxDN.  I, Emelia Salisbury, CMA, am acting as scribe for Sarina Ser, MD. Documentation: I have reviewed the above documentation for accuracy and completeness, and I agree with the above.  Sarina Ser, MD

## 2022-03-22 NOTE — Patient Instructions (Signed)
Recommend daily broad spectrum sunscreen SPF 30+ to sun-exposed areas, reapply every 2 hours as needed. Call for new or changing lesions.  ?Staying in the shade or wearing long sleeves, sun glasses (UVA+UVB protection) and wide brim hats (4-inch brim around the entire circumference of the hat) are also recommended for sun protection.  ? ? ?Melanoma ABCDEs ? ?Melanoma is the most dangerous type of skin cancer, and is the leading cause of death from skin disease.  You are more likely to develop melanoma if you: ?Have light-colored skin, light-colored eyes, or red or blond hair ?Spend a lot of time in the sun ?Tan regularly, either outdoors or in a tanning bed ?Have had blistering sunburns, especially during childhood ?Have a close family member who has had a melanoma ?Have atypical moles or large birthmarks ? ?Early detection of melanoma is key since treatment is typically straightforward and cure rates are extremely high if we catch it early.  ? ?The first sign of melanoma is often a change in a mole or a new dark spot.  The ABCDE system is a way of remembering the signs of melanoma. ? ?A for asymmetry:  The two halves do not match. ?B for border:  The edges of the growth are irregular. ?C for color:  A mixture of colors are present instead of an even brown color. ?D for diameter:  Melanomas are usually (but not always) greater than 66m - the size of a pencil eraser. ?E for evolution:  The spot keeps changing in size, shape, and color. ? ?Please check your skin once per month between visits. You can use a small mirror in front and a large mirror behind you to keep an eye on the back side or your body.  ? ?If you see any new or changing lesions before your next follow-up, please call to schedule a visit. ? ?Please continue daily skin protection including broad spectrum sunscreen SPF 30+ to sun-exposed areas, reapplying every 2 hours as needed when you're outdoors.  ? ?Staying in the shade or wearing long sleeves, sun  glasses (UVA+UVB protection) and wide brim hats (4-inch brim around the entire circumference of the hat) are also recommended for sun protection.   ? ?If You Need Anything After Your Visit ? ?If you have any questions or concerns for your doctor, please call our main line at [phone] press option 4 to reach your doctor's medical assistant. If no one answers, please leave a voicemail as directed and we will return your call as soon as possible. Messages left after 4 pm will be answered the following business day.  ? ?You may also send [place] message via MyChart. We typically respond to MyChart messages within 1-2 business days. ? ?For prescription refills, please ask your pharmacy to contact our office. Our fax number is [phone] ? ?If you have an urgent issue when the clinic is closed that cannot wait until the next business day, you can page your doctor at the number below.   ? ?Please note that while we do our best to be available for urgent issues outside of office hours, we are not available 24/7.  ? ?If you have an urgent issue and are unable to reach [place] you may choose to seek medical care at your doctor's office, retail clinic, urgent care center, or emergency room. ? ?If you have a medical emergency, please immediately call 911 or go to the emergency department. ? ?Pager Numbers ? ?- Dr. KNehemiah Massed [phone]? ?-  Dr. Laurence Ferrari: [phone] ? ?- Dr. Nicole Kindred: [phone] ? ?In the event of inclement weather, please call our main line at [phone] for an update on the status of any delays or closures. ? ?Dermatology Medication Tips: ?Please keep the boxes that topical medications come in in order to help keep track of the instructions about where and how to use these. Pharmacies typically print the medication instructions only on the boxes and not directly on the medication tubes.  ? ?If your medication is too expensive, please contact our office at [phone] option 4 or send [place] a message  through [facility].  ? ?We are unable to tell what your co-pay for medications will be in advance as this is different depending on your insurance coverage. However, we may be able to find a substitute medication at lower cost or fill out paperwork to get insurance to cover a needed medication.  ? ?If a prior authorization is required to get your medication covered by your insurance company, please allow [place] 1-2 business days to complete this process. ? ?Drug prices often vary depending on where the prescription is filled and some pharmacies may offer cheaper prices. ? ?The website www.goodrx.com contains coupons for medications through different pharmacies. The prices here do not account for what the cost may be with help from insurance (it may be cheaper with your insurance), but the website can give you the price if you did not use any insurance.  ?- You can print the associated coupon and take it with your prescription to the pharmacy.  ?- You may also stop by our office during regular business hours and pick up a GoodRx coupon card.  ?- If you need your prescription sent electronically to a different pharmacy, notify our office through [facility] or by phone at [phone] option 4. ? ? ? ? ?Si Usted Necesita Algo Despu?s de Su Visita ? ?Tambi?n puede enviarnos un mensaje a trav?s de MyChart. Por lo general respondemos a los mensajes de MyChart en el transcurso de 1 a 2 d?as h?biles. ? ?Para renovar recetas, por favor pida a su farmacia que se ponga en contacto con nuestra oficina. Nuestro n?mero de fax es el [phone]. ? ?Si tiene un asunto urgente cuando la cl?nica est? cerrada y que no puede esperar hasta el siguiente d?a h?bil, puede llamar/localizar a su doctor(a) al n?mero que aparece a continuaci?n.  ? ?Por favor, tenga en cuenta que aunque hacemos todo lo posible para estar disponibles para asuntos urgentes fuera del horario de oficina, no estamos disponibles las 24 horas del d?a, los 7 d?as de  la semana.  ? ?Si tiene un problema urgente y no puede comunicarse con nosotros, puede optar por buscar atenci?n m?dica  en el consultorio de su doctor(a), en una cl?nica privada, en un centro de atenci?n urgente o en una sala de emergencias. ? ?Si tiene Engineer, maintenance (IT) m?dica, por favor llame inmediatamente al 911 o vaya a la sala de emergencias. ? ?N?meros de b?per ? ?- Dr. Nehemiah Massed: [phone] ? ?- Dra. Moye: [phone] ? ?- Dra. Nicole Kindred: [phone] ? ?En caso de inclemencias del tiempo, por favor llame a nuestra l?nea principal al [phone] para una actualizaci?n sobre el estado de cualquier retraso o cierre. ? ?Consejos para la medicaci?n en dermatolog?a: ?Por favor, guarde las cajas en las que vienen los medicamentos de uso t?pico para ayudarle a seguir las instrucciones sobre d?nde y c?mo usarlos. Las farmacias generalmente imprimen las instrucciones del medicamento s?lo en las cajas y  no directamente en los tubos del medicamento.  ? ?Si su medicamento es muy caro, por favor, p?ngase en contacto con Zigmund Daniel llamando al [phone] y presione la opci?n 4 o env?enos un mensaje a trav?s de MyChart.  ? ?No podemos decirle cu?l ser? su copago por los medicamentos por adelantado ya que esto es diferente dependiendo de la cobertura de su seguro. Sin embargo, es posible que podamos encontrar un medicamento sustituto a Electrical engineer un formulario para que el seguro cubra el medicamento que se considera necesario.  ? ?Si se requiere Ardelia Mems autorizaci?n previa para que su compa??a de seguros [place] su medicamento, por favor perm?tanos de 1 a 2 d?as h?biles para completar este proceso. ? ?Los precios de los medicamentos var?an con frecuencia dependiendo del Environmental consultant de d?nde se surte la receta y alguna farmacias pueden ofrecer precios m?s baratos. ? ?El sitio web www.goodrx.com tiene cupones para medicamentos de Airline pilot. Los precios aqu? no tienen en cuenta lo que podr?a costar con la ayuda  del seguro (puede ser m?s barato con su seguro), pero el sitio web puede darle el precio si no utiliz? ning?n seguro.  ?- Puede imprimir el cup?n correspondiente y llevarlo con su receta a la farmacia.  ?-

## 2022-04-06 ENCOUNTER — Encounter: Payer: Self-pay | Admitting: Dermatology

## 2022-04-09 ENCOUNTER — Encounter: Payer: Self-pay | Admitting: Family Medicine

## 2022-04-09 ENCOUNTER — Ambulatory Visit: Payer: 59 | Admitting: Family Medicine

## 2022-04-09 VITALS — BP 120/60 | HR 69 | Temp 97.0°F | Wt 192.1 lb

## 2022-04-09 DIAGNOSIS — J069 Acute upper respiratory infection, unspecified: Secondary | ICD-10-CM

## 2022-04-09 NOTE — Progress Notes (Signed)
Subjective:     Azalyn Sliwa Foos is a 31 y.o. female presenting for Sinus Problem (X 2 days )     Sinus Problem This is a new problem. Episode onset: 04/07/2022. The problem is unchanged. There has been no fever. Associated symptoms include chills, congestion, sinus pressure and a sore throat. Pertinent negatives include no coughing, headaches or shortness of breath. Treatments tried: dayquil/nyquil. The treatment provided moderate relief.   No know sick contact - does nanny Some nausea from the drainage in the AM   Review of Systems  Constitutional:  Positive for chills.  HENT:  Positive for congestion, rhinorrhea, sinus pressure and sore throat.   Respiratory:  Negative for cough and shortness of breath.   Cardiovascular:  Negative for chest pain.  Gastrointestinal:  Positive for nausea. Negative for diarrhea and vomiting.  Musculoskeletal:  Negative for arthralgias and myalgias.  Neurological:  Negative for headaches.    Social History   Tobacco Use  Smoking Status Never  Smokeless Tobacco Never        Objective:    BP Readings from Last 3 Encounters:  04/09/22 120/60  08/04/21 112/80  07/29/21 130/90   Wt Readings from Last 3 Encounters:  04/09/22 192 lb 2 oz (87.1 kg)  08/04/21 185 lb 6 oz (84.1 kg)  07/24/21 202 lb (91.6 kg)    BP 120/60   Pulse 69   Temp (!) 97 F (36.1 C) (Temporal)   Wt 192 lb 2 oz (87.1 kg)   SpO2 99%   Breastfeeding Yes   BMI 30.09 kg/m    Physical Exam Constitutional:      General: She is not in acute distress.    Appearance: She is well-developed. She is not diaphoretic.  HENT:     Head: Normocephalic and atraumatic.     Right Ear: Tympanic membrane and ear canal normal.     Left Ear: Tympanic membrane and ear canal normal.     Nose: Mucosal edema and rhinorrhea present.     Right Sinus: No maxillary sinus tenderness or frontal sinus tenderness.     Left Sinus: No maxillary sinus tenderness or frontal sinus  tenderness.     Mouth/Throat:     Pharynx: Uvula midline. Posterior oropharyngeal erythema present. No oropharyngeal exudate.     Tonsils: 0 on the right. 0 on the left.  Eyes:     General: No scleral icterus.    Conjunctiva/sclera: Conjunctivae normal.  Cardiovascular:     Rate and Rhythm: Normal rate and regular rhythm.     Heart sounds: Normal heart sounds. No murmur heard. Pulmonary:     Effort: Pulmonary effort is normal. No respiratory distress.     Breath sounds: Normal breath sounds.  Musculoskeletal:     Cervical back: Neck supple.  Lymphadenopathy:     Cervical: No cervical adenopathy.  Skin:    General: Skin is warm and dry.     Capillary Refill: Capillary refill takes less than 2 seconds.  Neurological:     Mental Status: She is alert.          Assessment & Plan:   Problem List Items Addressed This Visit   None Visit Diagnoses     Viral URI    -  Primary   Breast feeding status of mother          Discussed likely viral vs allergy  Discussed pseudoephdrine will likely decrease supply - to only take if ready to stop Discussed daily allergy  medication my decrease - but would be OK to try and stop if she noticed less milk  Ok for flonase and neti pot If symptoms worsen or do not resolve call next week and will do ABX  Return if symptoms worsen or fail to improve.  Lesleigh Noe, MD

## 2022-04-09 NOTE — Patient Instructions (Signed)
Based on your symptoms, it looks like you have a virus.   Antibiotics are not need for a viral infection but the following will help:   Drink plenty of fluids Get lots of rest  Sinus Congestion 1) Neti Pot (Saline rinse) -- 2 times day -- if tolerated 2) Flonase (Store Brand ok) - once daily 3) Daily allergy pill - may reduce supply just monitor  Do not take pseudoephedrine unless ready to potentially stop pumping  Cough 1) Cough drops can be helpful 2) Nyquil (or nighttime cough medication) 3) Honey is proven to be one of the best cough medications  4) Cough medicine with Dextromethorphan can also be helpful  Sore Throat 1) Honey as above, cough drops 2) Ibuprofen or Aleve can be helpful 3) Salt water Gargles  If you develop fevers (Temperature >100.4), chills, worsening symptoms or symptoms lasting longer than 10 days return to clinic.

## 2022-04-11 ENCOUNTER — Inpatient Hospital Stay (HOSPITAL_COMMUNITY)
Admission: AD | Admit: 2022-04-11 | Discharge: 2022-04-12 | Disposition: A | Discharge status: Home / Self Care | Payer: 59 | Attending: Obstetrics and Gynecology | Admitting: Obstetrics and Gynecology

## 2022-04-11 DIAGNOSIS — N939 Abnormal uterine and vaginal bleeding, unspecified: Secondary | ICD-10-CM | POA: Insufficient documentation

## 2022-04-11 NOTE — MAU Note (Signed)
.  Amy Harvey is a 31 y.o. presents  here in MAU reporting: IUD inserted on the 9th. Spotting after, last 3 days bleeding is heavier. Pt currently breast feeding. Feeling bloated.  Pain score: cramping Vitals:   04/11/22 2320  BP: 115/76  Pulse: 90  Resp: 17  Temp: 97.8 F (36.6 C)  SpO2: 100%

## 2022-04-11 NOTE — MAU Provider Note (Signed)
Event Date/Time   First Provider Initiated Contact with Patient 04/11/22 2334      S Ms. Barb Shear Brummitt is a 31 y.o. G1P0101 patient who presents to MAU today with complaint of vaginal bleeding in setting of IUD.   O BP 115/76 (BP Location: Right Arm)   Pulse 90   Temp 97.8 F (36.6 C) (Oral)   Resp 17   Ht '5\' 7"'$  (1.702 m)   Wt 88 kg   SpO2 100%   BMI 30.38 kg/m  Physical Exam Vitals reviewed.  Constitutional:      General: She is not in acute distress.    Appearance: Normal appearance.  HENT:     Head: Normocephalic and atraumatic.  Eyes:     Conjunctiva/sclera: Conjunctivae normal.  Pulmonary:     Effort: Pulmonary effort is normal. No respiratory distress.  Musculoskeletal:        General: Normal range of motion.     Cervical back: Normal range of motion.  Neurological:     Mental Status: She is alert and oriented to person, place, and time.  Psychiatric:        Mood and Affect: Mood normal.        Behavior: Behavior normal.    A Medical screening exam complete [facility] s/t IUD  P -Informed that only pregnant and postpartum women seen at this facility. -Patient given the option of transfer to [facility] for further evaluation or seek care in outpatient facility of choice. -Patient reports discomfort with going to [facility] for gynecological related issues.  -Encouraged to follow up with primary office.  -Discharge from MAU in stable condition   Gavin Pound, CNM 04/11/2022 11:34 PM

## 2022-05-10 ENCOUNTER — Telehealth: Payer: Self-pay | Admitting: Family Medicine

## 2022-05-10 MED ORDER — SERTRALINE HCL 25 MG PO TABS: 25.0000 mg | ORAL_TABLET | Freq: Every day | ORAL | 0 refills | Status: DC

## 2022-05-10 NOTE — Telephone Encounter (Signed)
Patient callling stating that the medication will not be covered by insurance unless it is a 90 days supply   Encourage patient to contact the pharmacy for refills or they can request refills through [facility]  Did the patient contact the pharmacy:  Y   LAST APPOINTMENT DATE:  5.26.23 NEXT APPOINTMENT DATE:  9.21.23  MEDICATION: sertraline (ZOLOFT) 25 MG tablet  Is the patient out of medication? Y  If not, how much is left? 1 pill, today only  Is this a 90 day supply: Y  PHARMACY:  [facility] [phone] [place], [place] - [address] Phone:  [phone]  Fax:  [phone]     Let patient know to contact pharmacy at the end of the day to make sure medication is ready.  Please notify patient to allow 48-72 hours to process

## 2022-06-23 ENCOUNTER — Ambulatory Visit (INDEPENDENT_AMBULATORY_CARE_PROVIDER_SITE_OTHER): Payer: 59 | Admitting: Family Medicine

## 2022-06-23 VITALS — BP 110/70 | HR 68 | Temp 97.5°F | Wt 197.5 lb

## 2022-06-23 DIAGNOSIS — O99345 Other mental disorders complicating the puerperium: Secondary | ICD-10-CM | POA: Diagnosis not present

## 2022-06-23 DIAGNOSIS — F418 Other specified anxiety disorders: Secondary | ICD-10-CM | POA: Diagnosis not present

## 2022-06-23 DIAGNOSIS — L740 Miliaria rubra: Secondary | ICD-10-CM | POA: Diagnosis not present

## 2022-06-23 DIAGNOSIS — F4323 Adjustment disorder with mixed anxiety and depressed mood: Secondary | ICD-10-CM | POA: Insufficient documentation

## 2022-06-23 MED ORDER — SERTRALINE HCL 50 MG PO TABS: 50.0000 mg | ORAL_TABLET | Freq: Every day | ORAL | 0 refills | Status: DC

## 2022-06-23 NOTE — Patient Instructions (Addendum)
Likely a heat rash - can do lotion as needed - monitor for shellfish in the future   Anxiety - increase to 50 mg - if no improvement in 2 weeks ok to increase to 75 mg or 100 mg as desired - return in 6 weeks to check in

## 2022-06-23 NOTE — Assessment & Plan Note (Signed)
Was initially doing well on sertraline 25 mg, but in the setting of weaning breast-feeding she has noticed more irritability and anxiety.  Increase Zoloft to 50 mg, if no improvement in 2 weeks okay to increase to 100 mg.  Follow-up in 6 weeks.

## 2022-06-23 NOTE — Progress Notes (Signed)
Subjective:     Amy Harvey is a 31 y.o. female presenting for Rash (On neck x 3 days )     HPI  #Neck Rash - anterior neck - was bumpy - less bothersome - noticed on Sunday night - was itchy - red and inflamed - was at the beach - one day in the sun - returned home Monday night - cortisone cream and benadryl - no new foods - did notice it when she was eating shrimp - was staying in her beach house with her typical products - did feel some swelling but no significant scratchy throat or throat pain   #breast feeding weaning - is working on weaning  - is having some emotional  - has been pumping exclusively - anxiety is up - down from 8 times a day to 2-3 times a day of pumping - baby bonding is still good - was on effexor before pregnancy - does not want to restart this   Review of Systems   Social History   Tobacco Use  Smoking Status Never  Smokeless Tobacco Never        Objective:    BP Readings from Last 3 Encounters:  06/23/22 110/70  04/11/22 115/76  04/09/22 120/60   Wt Readings from Last 3 Encounters:  06/23/22 197 lb 8 oz (89.6 kg)  04/11/22 194 lb (88 kg)  04/09/22 192 lb 2 oz (87.1 kg)    BP 110/70   Pulse 68   Temp (!) 97.5 F (36.4 C) (Temporal)   Wt 197 lb 8 oz (89.6 kg)   SpO2 98%   Breastfeeding No   BMI 30.93 kg/m    Physical Exam Constitutional:      General: She is not in acute distress.    Appearance: She is well-developed. She is not diaphoretic.  HENT:     Right Ear: External ear normal.     Left Ear: External ear normal.     Nose: Nose normal.  Eyes:     Conjunctiva/sclera: Conjunctivae normal.  Cardiovascular:     Rate and Rhythm: Normal rate.  Pulmonary:     Effort: Pulmonary effort is normal.  Musculoskeletal:     Cervical back: Neck supple.  Skin:    General: Skin is warm and dry.     Capillary Refill: Capillary refill takes less than 2 seconds.     Comments: Neck with a few scattered papules  which are flesh colored. No erythema.   Neurological:     Mental Status: She is alert. Mental status is at baseline.  Psychiatric:        Mood and Affect: Mood normal.        Behavior: Behavior normal.           Assessment & Plan:   Problem List Items Addressed This Visit       Other   Postpartum anxiety    Was initially doing well on sertraline 25 mg, but in the setting of weaning breast-feeding she has noticed more irritability and anxiety.  Increase Zoloft to 50 mg, if no improvement in 2 weeks okay to increase to 100 mg.  Follow-up in 6 weeks.      Relevant Medications   sertraline (ZOLOFT) 50 MG tablet   Other Visit Diagnoses     Heat rash    -  Primary      Suspect the rash that she experienced which is already resolved was a heat rash.  Could also have  been a shellfish allergy, though no history of prior.  Discussed monitoring closely and next time she does eat shellfish or shrimp.  Otherwise update if rash recurs.  Return in about 6 weeks (around 08/04/2022) for anxiety.  Lesleigh Noe, MD

## 2022-08-04 ENCOUNTER — Ambulatory Visit: Payer: 59 | Admitting: Family Medicine

## 2022-08-04 ENCOUNTER — Ambulatory Visit (INDEPENDENT_AMBULATORY_CARE_PROVIDER_SITE_OTHER): Payer: 59 | Admitting: Family Medicine

## 2022-08-04 VITALS — BP 100/60 | HR 67 | Temp 97.2°F | Wt 199.4 lb

## 2022-08-04 DIAGNOSIS — O99345 Other mental disorders complicating the puerperium: Secondary | ICD-10-CM | POA: Diagnosis not present

## 2022-08-04 DIAGNOSIS — Z23 Encounter for immunization: Secondary | ICD-10-CM

## 2022-08-04 DIAGNOSIS — Z0000 Encounter for general adult medical examination without abnormal findings: Secondary | ICD-10-CM | POA: Diagnosis not present

## 2022-08-04 DIAGNOSIS — Z13220 Encounter for screening for lipoid disorders: Secondary | ICD-10-CM

## 2022-08-04 DIAGNOSIS — F418 Other specified anxiety disorders: Secondary | ICD-10-CM

## 2022-08-04 DIAGNOSIS — E669 Obesity, unspecified: Secondary | ICD-10-CM

## 2022-08-04 LAB — FERRITIN: Ferritin: 43 ng/mL (ref 10.0–291.0)

## 2022-08-04 LAB — COMPREHENSIVE METABOLIC PANEL
ALT: 26 U/L (ref 0–35)
AST: 21 U/L (ref 0–37)
Albumin: 4.3 g/dL (ref 3.5–5.2)
Alkaline Phosphatase: 96 U/L (ref 39–117)
BUN: 13 mg/dL (ref 6–23)
CO2: 30 mEq/L (ref 19–32)
Calcium: 9.7 mg/dL (ref 8.4–10.5)
Chloride: 102 mEq/L (ref 96–112)
Creatinine, Ser: 0.93 mg/dL (ref 0.40–1.20)
GFR: 82.12 mL/min (ref >60.00)
Glucose, Bld: 77 mg/dL (ref 70–99)
Potassium: 4.1 mEq/L (ref 3.5–5.1)
Sodium: 138 mEq/L (ref 135–145)
Total Bilirubin: 0.5 mg/dL (ref 0.2–1.2)
Total Protein: 6.9 g/dL (ref 6.0–8.3)

## 2022-08-04 LAB — CBC
HCT: 41.8 % (ref 36.0–46.0)
Hemoglobin: 14.5 g/dL (ref 12.0–15.0)
MCHC: 34.6 g/dL (ref 30.0–36.0)
MCV: 86.7 fl (ref 78.0–100.0)
Platelets: 166 10*3/uL (ref 150.0–400.0)
RBC: 4.83 Mil/uL (ref 3.87–5.11)
RDW: 12.8 % (ref 11.5–15.5)
WBC: 6.1 10*3/uL (ref 4.0–10.5)

## 2022-08-04 LAB — VITAMIN D 25 HYDROXY (VIT D DEFICIENCY, FRACTURES): VITD: 26.26 ng/mL — ABNORMAL LOW (ref 30.00–100.00)

## 2022-08-04 LAB — LIPID PANEL
Cholesterol: 172 mg/dL (ref 0–200)
HDL: 49.5 mg/dL (ref >39.00)
LDL Cholesterol: 97 mg/dL (ref 0–99)
NonHDL: 122.02
Total CHOL/HDL Ratio: 3
Triglycerides: 127 mg/dL (ref 0.0–149.0)
VLDL: 25.4 mg/dL (ref 0.0–40.0)

## 2022-08-04 LAB — TSH: TSH: 2.4 u[IU]/mL (ref 0.35–5.50)

## 2022-08-04 MED ORDER — SERTRALINE HCL 100 MG PO TABS: 100.0000 mg | ORAL_TABLET | Freq: Every day | ORAL | 1 refills | Status: DC

## 2022-08-04 NOTE — Patient Instructions (Addendum)
Check out [employer] - storming periods  Labs today  Return in 6-8 weeks for anxiety check  Mable Paris at [facility] is great!

## 2022-08-04 NOTE — Assessment & Plan Note (Signed)
Some improvement, but still with significant symptoms. Increase zoloft to 100 mg daily. Return 6 weeks for f/u

## 2022-08-04 NOTE — Progress Notes (Signed)
Annual Exam   Chief Complaint:  Chief Complaint  Patient presents with   Annual Exam    History of Present Illness:  [name] is a 31 y.o. G1P0101 who LMP was No LMP recorded. (Menstrual status: IUD)., presents today for her annual examination.    Anxiety - feels about the same - some things are better - only taking zoloft 50 mg, tolerating w/o any side effects - has noticed improved sleep - daughter is sleeping better  Nutrition/Lifestyle Diet: has noticed some emotional eating, trying to eat more regularly Exercise: not currently - dog walks She does get adequate calcium and Vitamin D in her diet.  Social History   Tobacco Use  Smoking Status Never  Smokeless Tobacco Never   Social History   Substance and Sexual Activity  Alcohol Use Yes   Comment: rare   Social History   Substance and Sexual Activity  Drug Use No     Safety The patient wears seatbelts: yes.     The patient feels safe at home and in their relationships: yes.  General Health Dentist in the last year: Yes Eye doctor: no  Menstrual IUD - no cycles  GYN She is single partner, contraception - IUD.     Cervical Cancer Screening (Age 18-65) Last Pap:  08/2020  - negative  Family History of Breast Cancer: yes Family History of Ovarian Cancer: no    Weight Wt Readings from Last 3 Encounters:  08/04/22 199 lb 6 oz (90.4 kg)  06/23/22 197 lb 8 oz (89.6 kg)  04/11/22 194 lb (88 kg)   Patient has high BMI  BMI Readings from Last 1 Encounters:  08/04/22 31.23 kg/m     Chronic disease screening Blood pressure monitoring:  BP Readings from Last 3 Encounters:  08/04/22 100/60  06/23/22 110/70  04/11/22 115/76     Lipid Monitoring: Indication for screening: age >37, obesity, diabetes, family hx, CV risk factors.  Lipid screening: Yes  No results found for: "CHOL", "HDL", "LDLCALC", "LDLDIRECT", "TRIG", "CHOLHDL"   Diabetes Screening: age >26, overweight,  family hx, PCOS, hx of gestational diabetes, at risk ethnicity, elevated blood pressure >135/80.  Diabetes Screening screening: Yes  No results found for: "HGBA1C"    Past Medical History:  Diagnosis Date   Allergy    grass, dust mites, white oak and hickory   Anxiety    Anxiety    Depression    Dysplastic nevus 04/24/2008   R mid med scapula - mild    Dysplastic nevus 04/24/2008   R upper back 2.5 cm lat to spine - mild    Dysplastic nevus 03/10/2017   L post shoulder axillary area - mild    Dysplastic nevus 03/13/2018   R upper back 3.5 cm lat to spine - severe, excision 07/11/2018   Dysplastic nevus 03/21/2019   R back 3.5 cm lat to spine - mod    Dysplastic nevus 03/16/2021   left ear sup helix - sup margins fee but close - recheck on follow up 09/2021   Vaginal Pap smear, abnormal     Past Surgical History:  Procedure Laterality Date   CESAREAN SECTION N/A 07/24/2021   Procedure: CESAREAN SECTION;  Surgeon: Tyson Dense, MD;  Location: [facility];  Service: Obstetrics;  Laterality: N/A;   TONSILLECTOMY AND ADENOIDECTOMY     as child   WISDOM TOOTH EXTRACTION      Prior to Admission medications   Medication Sig Start Date End  Date Taking? Authorizing Provider  levonorgestrel (MIRENA, 52 MG,) 20 MCG/DAY IUD Provided by care center 03/23/22  Yes [provider]  sertraline (ZOLOFT) 50 MG tablet Take 1 tablet (50 mg total) by mouth daily. 06/23/22  Yes Lesleigh Noe, MD    No Known Allergies  Gynecologic History: No LMP recorded. (Menstrual status: IUD).  Obstetric History: G1P0101  Social History   Socioeconomic History   Marital status: Married    Spouse name: Amy Harvey   Number of children: 1   Years of education: college   Highest education level: Not on file  Occupational History   Occupation: Lexicographer: unemployed    Comment: Cabin crew, psycholog  Tobacco Use   Smoking status: Never   Smokeless tobacco: Never  Vaping Use   Vaping  Use: Never used  Substance and Sexual Activity   Alcohol use: Yes    Comment: rare   Drug use: No   Sexual activity: Yes    Partners: Male    Comment: planning IUD  Other Topics Concern   Not on file  Social History Narrative   08/04/21   From: the area   Living: with husband, Amy Harvey (2020 - met 2010) and child   Work: nanny currently      Family: mom is nearby, dad has passed. In-laws just moved to area, Daughter - Amy Harvey (2022)      Enjoys: movies, concerts, special outings with husband      Exercise: walking dogs regularly with work   Diet: healthy diet      Safety   Seat belts: Yes    Guns: Yes  and secure   Safe in relationships: Yes       Social Determinants of Radio broadcast assistant Strain: Not on file  Food Insecurity: Not on file  Transportation Needs: Not on file  Physical Activity: Not on file  Stress: Not on file  Social Connections: Not on file  Intimate Partner Violence: Not on file    Family History  Problem Relation Age of Onset   Breast cancer Mother 74       not genetic   Coronary artery disease Father    Diabetes Father    Hypertension Father    Cirrhosis Father    Heart attack Father 61   Alzheimer's disease Maternal Grandmother    Parkinson's disease Maternal Grandfather    Stroke Paternal Grandmother    Vision loss Paternal Grandmother    Coronary artery disease Paternal Grandfather    Diabetes Paternal Grandfather    Cirrhosis Paternal Grandfather     Review of Systems  Constitutional:  Negative for chills and fever.  HENT:  Positive for congestion. Negative for sore throat.        Swollen lymph nodes  Eyes:  Negative for blurred vision and double vision.  Respiratory:  Negative for shortness of breath.   Cardiovascular:  Negative for chest pain.  Gastrointestinal:  Negative for heartburn, nausea and vomiting.  Genitourinary: Negative.   Musculoskeletal: Negative.  Negative for myalgias.  Skin:  Negative for rash.   Neurological:  Negative for dizziness and headaches.  Endo/Heme/Allergies:  Does not bruise/bleed easily.  Psychiatric/Behavioral:  Negative for depression. The patient is not nervous/anxious.      Physical Exam BP 100/60   Pulse 67   Temp (!) 97.2 F (36.2 C) (Temporal)   Wt 199 lb 6 oz (90.4 kg)   SpO2 99%   BMI 31.23 kg/m  BP Readings from Last 3 Encounters:  08/04/22 100/60  06/23/22 110/70  04/11/22 115/76    Wt Readings from Last 3 Encounters:  08/04/22 199 lb 6 oz (90.4 kg)  06/23/22 197 lb 8 oz (89.6 kg)  04/11/22 194 lb (88 kg)     Physical Exam Constitutional:      General: She is not in acute distress.    Appearance: She is well-developed. She is not diaphoretic.  HENT:     Head: Normocephalic and atraumatic.     Right Ear: External ear normal.     Left Ear: External ear normal.     Nose: Nose normal.  Eyes:     General: No scleral icterus.    Extraocular Movements: Extraocular movements intact.     Conjunctiva/sclera: Conjunctivae normal.  Cardiovascular:     Rate and Rhythm: Normal rate and regular rhythm.     Heart sounds: No murmur heard. Pulmonary:     Effort: Pulmonary effort is normal. No respiratory distress.     Breath sounds: Normal breath sounds. No wheezing.  Abdominal:     General: Bowel sounds are normal. There is no distension.     Palpations: Abdomen is soft. There is no mass.     Tenderness: There is no abdominal tenderness. There is no guarding or rebound.  Musculoskeletal:        General: Normal range of motion.     Cervical back: Neck supple.  Lymphadenopathy:     Cervical: No cervical adenopathy.  Skin:    General: Skin is warm and dry.     Capillary Refill: Capillary refill takes less than 2 seconds.  Neurological:     Mental Status: She is alert and oriented to person, place, and time.     Deep Tendon Reflexes: Reflexes normal.  Psychiatric:        Mood and Affect: Mood normal.        Behavior: Behavior normal.        Results:    08/04/2022   12:49 PM 07/16/2016    1:24 PM  Depression screen PHQ 2/9  Decreased Interest 1 0  Down, Depressed, Hopeless 1 0  PHQ - 2 Score 2 0  Altered sleeping 0   Tired, decreased energy 2   Change in appetite 3   Feeling bad or failure about yourself  1   Trouble concentrating 1   Moving slowly or fidgety/restless 3   Suicidal thoughts 0   PHQ-9 Score 12   Difficult doing work/chores Somewhat difficult       08/04/2022   12:49 PM  GAD 7 : Generalized Anxiety Score  Nervous, Anxious, on Edge 3  Control/stop worrying 3  Worry too much - different things 2  Trouble relaxing 2  Restless 1  Easily annoyed or irritable 2  Afraid - awful might happen 1  Total GAD 7 Score 14  Anxiety Difficulty Somewhat difficult       Assessment: 30 y.o. G72P0101 female here for routine annual examination.  Plan: Problem List Items Addressed This Visit       Other   Postpartum anxiety    Some improvement, but still with significant symptoms. Increase zoloft to 100 mg daily. Return 6 weeks for f/u      Relevant Medications   sertraline (ZOLOFT) 100 MG tablet   Other Relevant Orders   CBC   Ferritin   TSH   Obesity (BMI 30.0-34.9)   Relevant Orders   Comprehensive metabolic panel  Vitamin D, 25-hydroxy   Other Visit Diagnoses     Annual physical exam    -  Primary   Need for influenza vaccination       Relevant Orders   Flu Vaccine QUAD 32moIM (Fluarix, Fluzone & Alfiuria Quad PF) (Completed)   Screening for hyperlipidemia       Relevant Orders   Lipid panel        Screening: -- Blood pressure screen normal -- cholesterol screening: will obtain -- Weight screening: overweight: continue to monitor -- Diabetes Screening: will obtain -- Nutrition: encouraged healthy diet   Psych -- Depression screening (PHQ-9):  [place] [facility] Total Score 12        Safety --  tobacco screening: not using -- alcohol screening:  low-risk usage. -- no evidence of domestic violence or intimate partner violence.   Cancer Screening -- pap smear not collected per ASCCP guidelines -- family history of breast cancer screening: done. not at high risk.   Immunizations Immunization History  Administered Date(s) Administered   Influenza,inj,Quad PF,6+ Mos 08/04/2021, 08/04/2022   Influenza-Unspecified 07/18/2019   Janssen (J&J) SARS-COV-2 Vaccination 01/27/2020   Tdap 07/15/2021   Varicella 04/15/2009    -- flu vaccine up to date -- TDAP q10 years up to date --- Covid-19 Vaccine up to date  Encouraged regular vision and dental screening. Encouraged healthy exercise and diet.   JLesleigh Noe

## 2022-08-05 ENCOUNTER — Telehealth: Payer: Self-pay | Admitting: Family Medicine

## 2022-08-05 ENCOUNTER — Encounter: Payer: 59 | Admitting: Family Medicine

## 2022-08-05 ENCOUNTER — Other Ambulatory Visit: Payer: Self-pay | Admitting: Family Medicine

## 2022-08-05 DIAGNOSIS — E559 Vitamin D deficiency, unspecified: Secondary | ICD-10-CM

## 2022-08-05 MED ORDER — VITAMIN D (ERGOCALCIFEROL) 1.25 MG (50000 UNIT) PO CAPS: 50000.0000 [IU] | ORAL_CAPSULE | ORAL | 1 refills | Status: DC

## 2022-08-05 NOTE — Telephone Encounter (Signed)
Patient called in and wanted to know why the Vitamin D, Ergocalciferol, (DRISDOL) 1.25 MG (50000 UNIT) CAPS capsule was prescribed. She stated she wasn't aware and just wants to know if something came back in her labs. Thank you!

## 2022-08-05 NOTE — Telephone Encounter (Signed)
Spoke to pt and went over labs with her and Dr. Verda Cumins feedback. Pt states that she will login to her mychart and look over everything later today.

## 2022-08-14 IMAGING — US US MFM OB DETAIL+14 WK
1 series · 12 of 28 positions shown · non-contrast
Comparison: none

[Series 1: us mfm ob detail+14 wk · 12 of 79 slices shown]
[im 3/79]
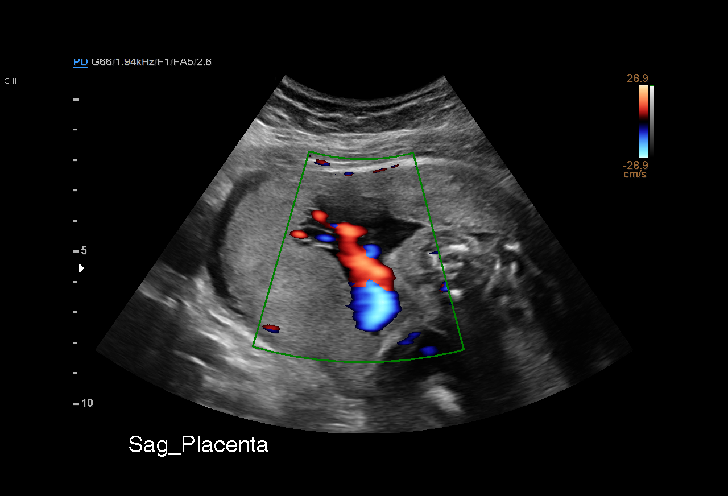
[im 9/79]
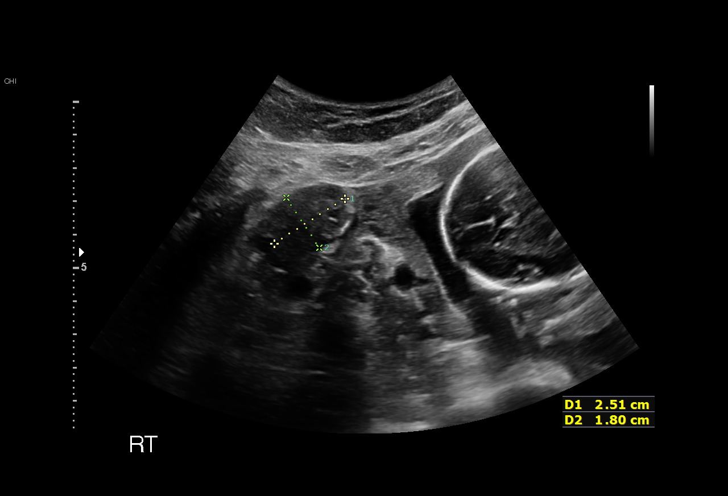
[im 15/79]
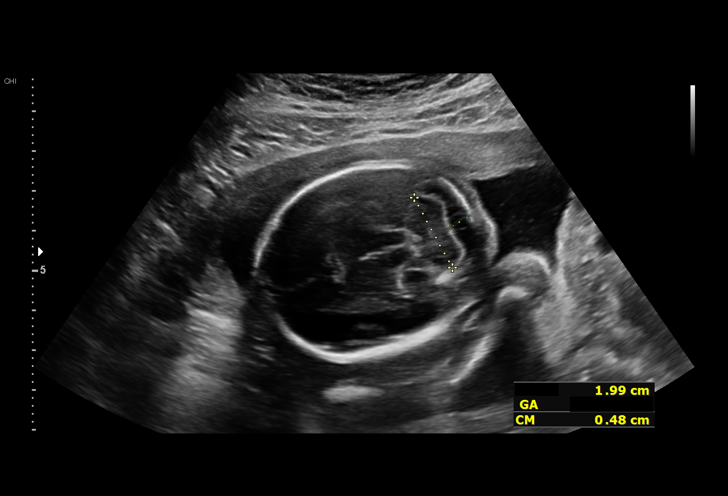
[im 24/79]
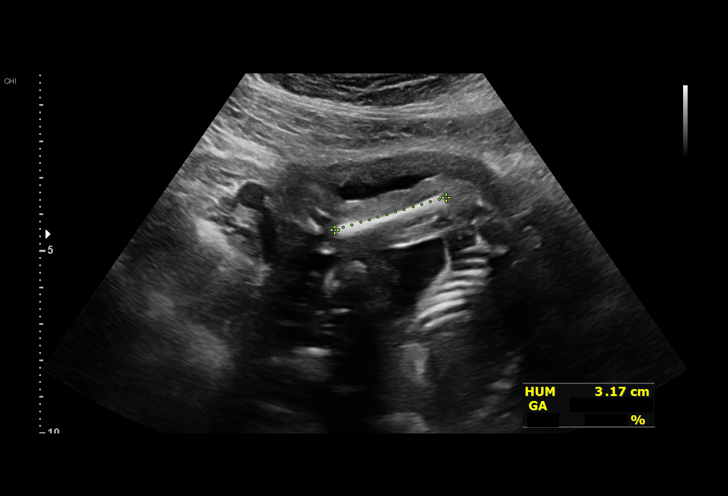
[im 29/79]
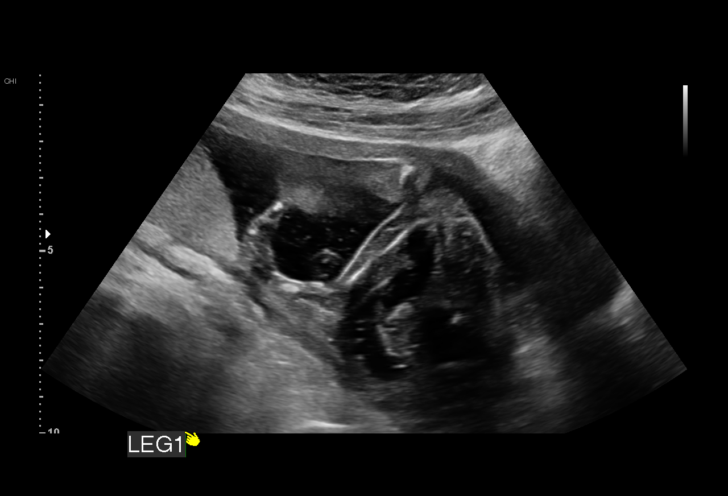
[im 35/79]
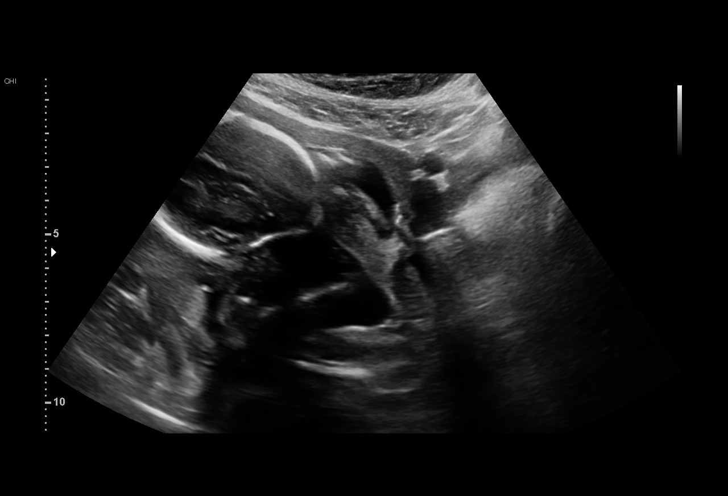
[im 44/79]
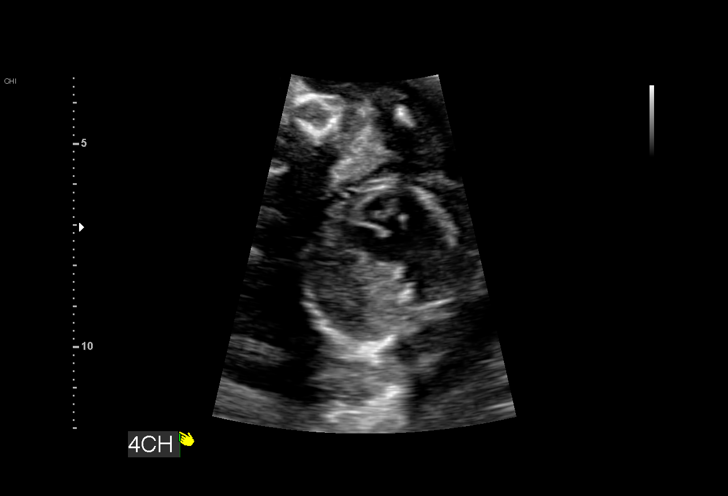
[im 50/79]
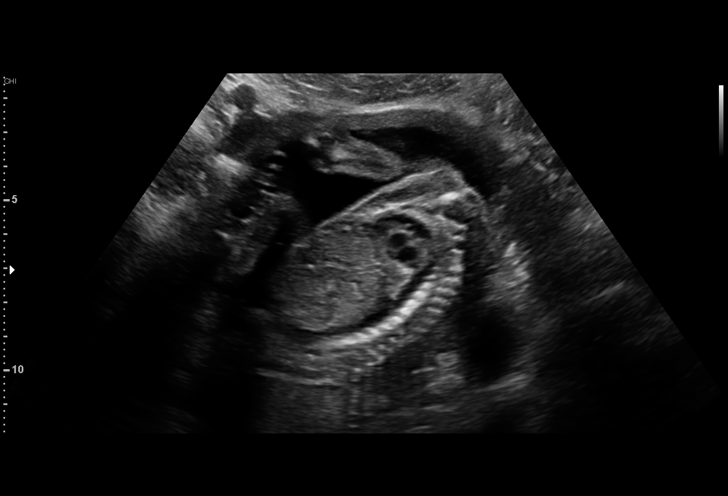
[im 55/79]
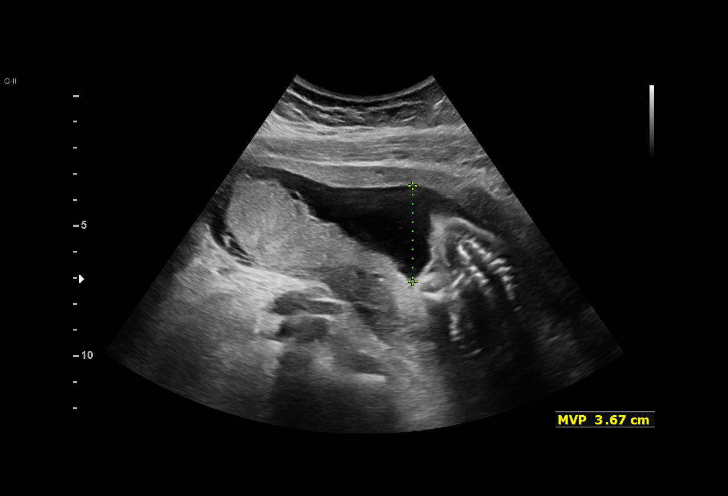
[im 64/79]
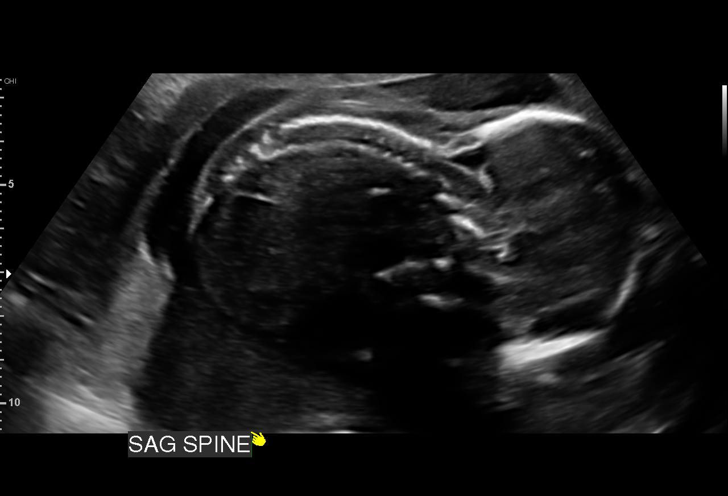
[im 70/79]
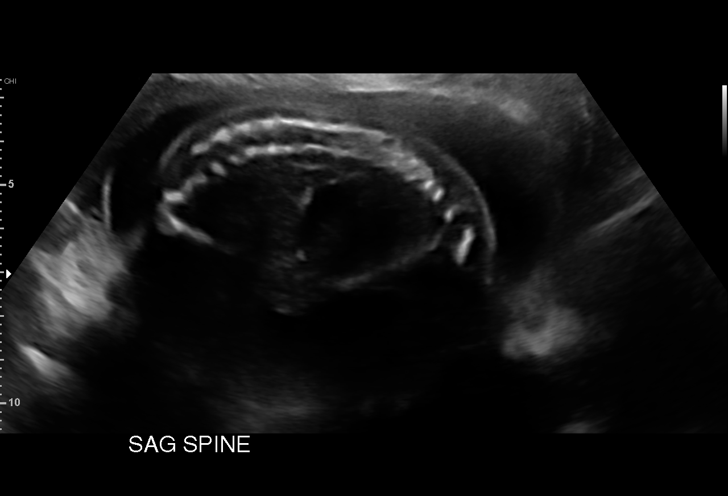
[im 76/79]
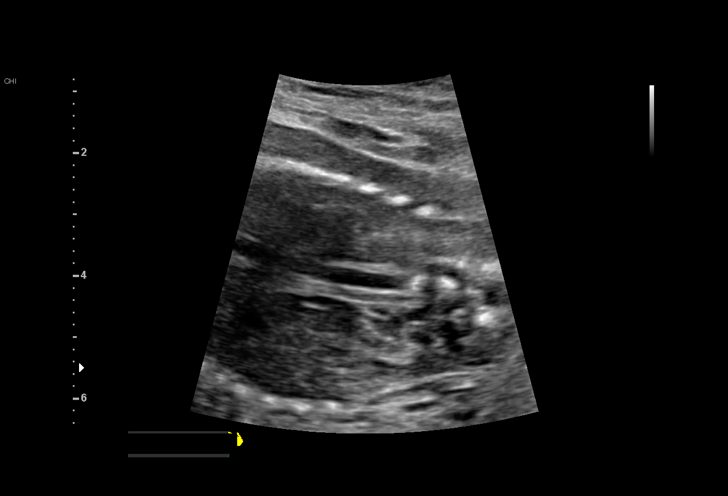

[12 of 28 positions shown; findings below may reference images not displayed]

1  US MFM OB DETAIL +14 WK               76811.01    WOOSVELT KICHNER

Indications

 Antenatal screening for elevated
 alphafetoprotein level (AFP) 2.6 MoM
 Encounter for antenatal screening for
 malformations
 21 weeks gestation of pregnancy
 LR NIPS, Neg Horizon
 Echogenic intracardiac focus of the heart
 (EIF)
 Marginal insertion of umbilical cord affecting
 management of mother in second trimester
Fetal Evaluation

 Num Of Fetuses:         1
 Fetal Heart Rate(bpm):  147
 Cardiac Activity:       Observed
 Presentation:           Breech
 Placenta:               Posterior
 P. Cord Insertion:      Marginal insertion

 Amniotic Fluid
 AFI FV:      Within normal limits

                             Largest Pocket(cm)


 Comment:    CI is greater than 2 cm away from placental edge
Biometry

 BPD:      44.1  mm     G. Age:  19w 2d        3.2  %    CI:        66.09   %    70 - 86
                                                         FL/HC:      17.7   %    15.9 -
 HC:       174   mm     G. Age:  20w 0d          6  %    HC/AC:      1.11        1.06 -
 AC:      156.7  mm     G. Age:  20w 6d         37  %    FL/BPD:     69.8   %
 FL:       30.8  mm     G. Age:  19w 4d          6  %    FL/AC:      19.7   %    20 - 24
 HUM:      31.6  mm     G. Age:  20w 4d         34  %
 CER:      19.9  mm     G. Age:  19w 2d         10  %
 LV:          7  mm
 CM:        4.8  mm

 Est. FW:     337  gm    0 lb 12 oz      11  %
OB History

 Gravidity:    1
 Living:       0
Gestational Age

 LMP:           21w 0d        Date:  01/01/21                 EDD:   10/08/21
 U/S Today:     20w 0d                                        EDD:   10/15/21
 Best:          21w 0d     Det. By:  LMP  (01/01/21)          EDD:   10/08/21
Anatomy

 Cranium:               Appears normal         Aortic Arch:            Not well visualized
 Cavum:                 Appears normal         Ductal Arch:            Appears normal
 Ventricles:            Appears normal         Diaphragm:              Not well visualized
 Choroid Plexus:        Appears normal         Stomach:                Appears normal, left
                                                                       sided
 Cerebellum:            Appears normal         Abdomen:                Appears normal
 Posterior Fossa:       Appears normal         Abdominal Wall:         Not well visualized
 Nuchal Fold:           Not applicable (>20    Cord Vessels:           Appears normal (3
                        wks GA)                                        vessel cord)
 Face:                  Appears normal         Kidneys:                Appear normal
                        (orbits and profile)
 Lips:                  Appears normal         Bladder:                Appears normal
 Thoracic:              Appears normal         Spine:                  Appears normal
 Heart:                 Not well visualized    Upper Extremities:      Appears normal
                        EIF
 RVOT:                  Appears normal         Lower Extremities:      Not well visualized
 LVOT:                  Appears normal

 Other:  Heels and 5th digit not well visualized. VC, 3VV and 3VTV not well
         visualized. Fetus appears to be female.Patient does not want to know
         the gender of the baby.
Cervix Uterus Adnexa

 Cervix
 Length:           3.26  cm.
 Normal appearance by transabdominal scan.
 Right Ovary
 Visualized.

 Left Ovary
 Not visualized.
Impression

 We performed fetal anatomy scan.  An echogenic
 intracardiac focus is seen.  No other markers of aneuploidies
 or fetal structural defects are seen.  Intracranial structures
 and fetal spine appear normal.  There is no evidence of spina
 bifida.  Abdominal wall could not be assessed clearly but
 there is no evidence of omphalocele or gastroschisis.
 Marginal cord insertion is seen.

 "MyChart":Patient does not want to know fetal sex/gender.
 We informed her that fetal sex/gender will be mentioned in
 ultrasound report and will be in "MyChart" that will be
 accessible to the patient .
 xxxxxxxxxxxxxxxxxxxxxxxxxxxxxxxxxxxxxxxxxxxxxxxxxxx

 I had the pleasure of seeing Ms. Yehikon today at the
 [HOSPITAL].  She is here for a second
 opinion fetal anatomy scan.
 On your office ultrasound, marginal cord insertion was seen.
 MSAFP screening showed increased risk for open neural
 tube defect (OSBR 1 and 238; AFP 2.6 MoM).  Patient does
 not give history of vaginal bleeding in this pregnancy.  On cell
 free fetal DNA screening, she had low risk for fetal
 aneuploidies.
 Her prenatal course has otherwise been uneventful.
 Our concerns include
 Increased risk for open-neural tube defects and increased
 AFP
 I reassured the patient of normal fetal anatomy on ultrasound
 including spine and intracranial structures. I informed her that
 ultrasound can detect up to [DATE] cases of spina
 bifida and that amniocentesis will only marginally improve the
 detection rate.
 Increased AFP can also be associated with fetal growth
 restriction (placental insufficiency), preterm delivery. I
 recommended serial fetal growth assessments. Vaginal
 bleeding can lead to increased MSAFP.

 Echogenic intracardiac focus
 Echogenic intracardiac focus is seen in about 3% of normal
 fetuses (more in Asian population), and in about 15%-20% of
 fetuses with Down syndrome. She was reassured that
 echogenic focus is not associated with any structural heart
 malformations.  Given that she had low risk for Down
 syndrome on cell free fetal DNA screening, this finding should
 be considered a normal variant and not a marker for Down
 syndrome.
 I explained that only amniocentesis will give a definitive result
 on the fetal karyotype.  I discussed the procedure and
 possible complication of miscarriage (1 and 500 procedures).
 Patient opted not to have amniocentesis.

 Marginal cord insertion
 I explained the finding with help of diagrams.  Marginal cord
 insertion is usually associated with good fetal outcomes.  In
 some cases, it can be associated with fetal growth restriction.
 We recommend serial fetal growth assessments.
Recommendations

 -An appointment was made for her to return in 4 weeks for
 completion of fetal anatomy (and revisit spine-coronal views)
 -Fetal growth assessment every 4 weeks till delivery.
                 Toquica, Kelly Andrea

## 2022-09-15 IMAGING — US US MFM OB FOLLOW-UP
1 series · 13 of 28 positions shown · non-contrast
Comparison: none

[Series 1: us mfm ob follow-up · 13 of 66 slices shown]
[im 3/66]
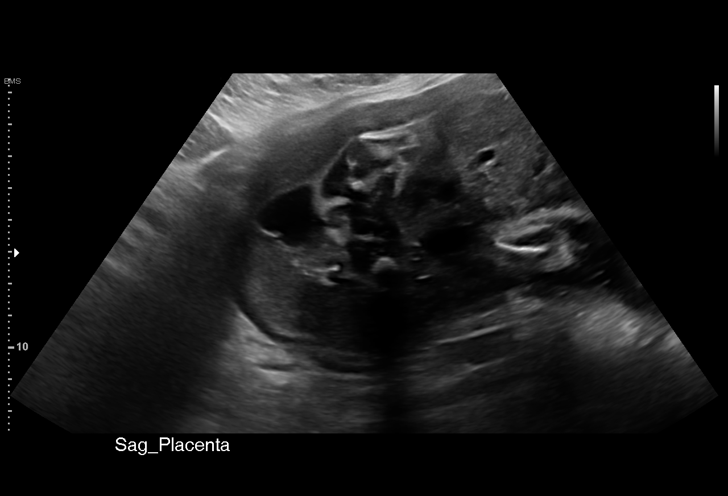
[im 8/66]
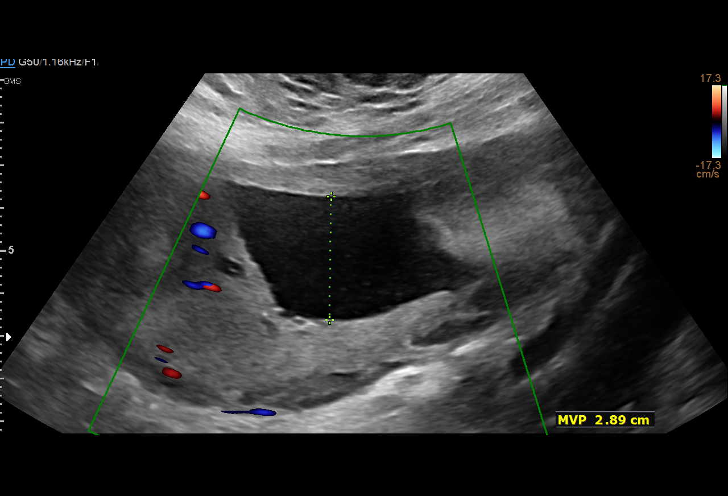
[im 13/66]
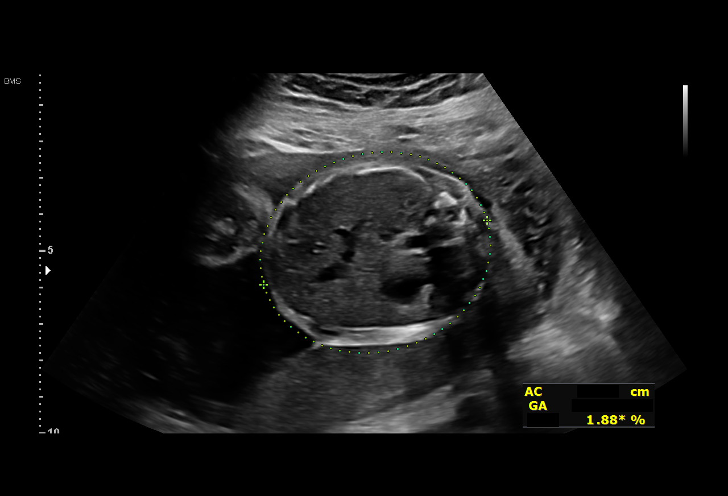
[im 17/66]
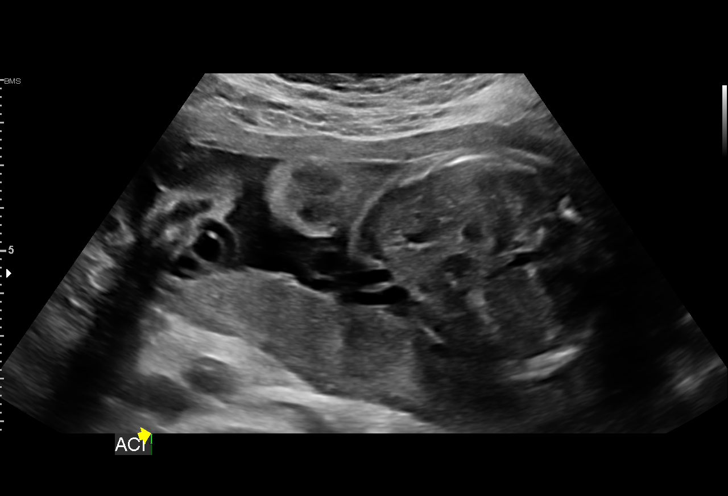
[im 22/66]
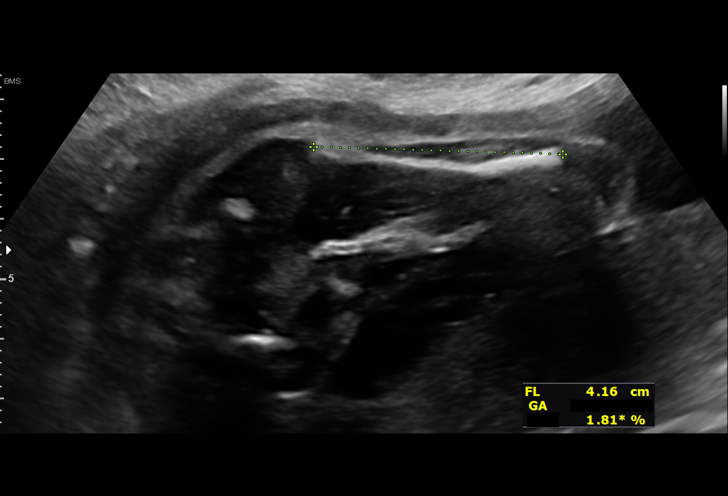
[im 27/66]
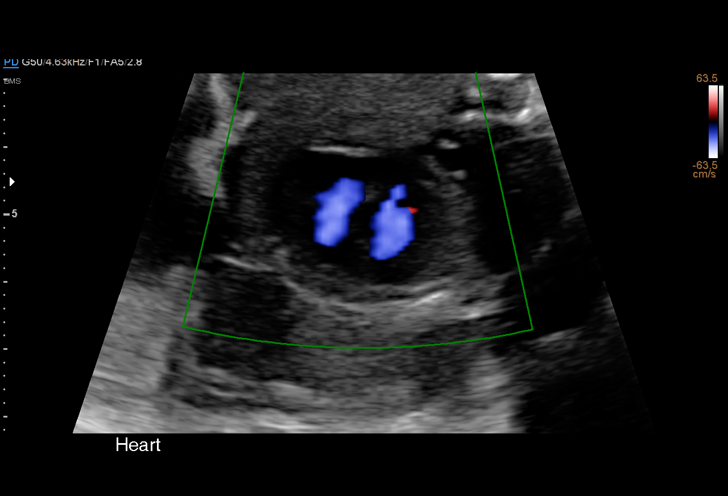
[im 34/66]
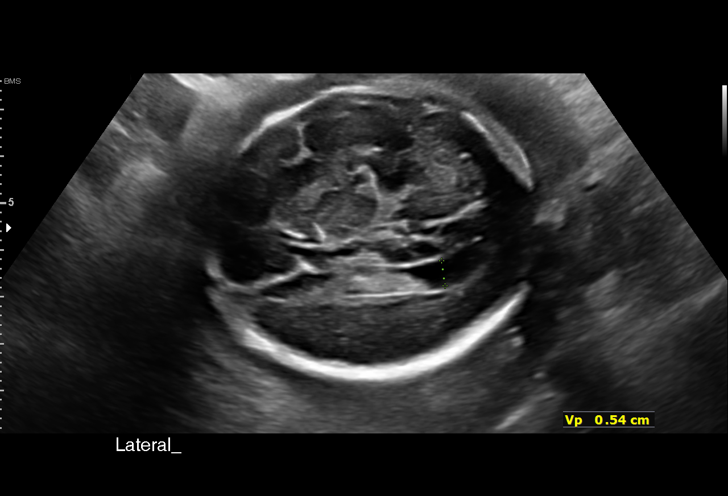
[im 39/66]
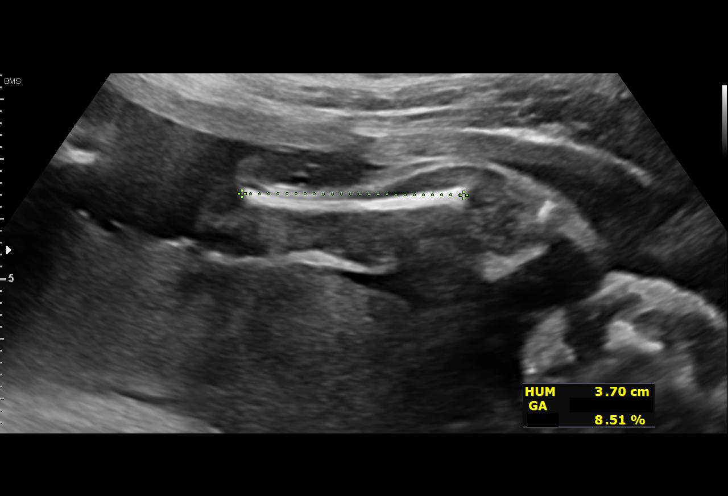
[im 44/66]
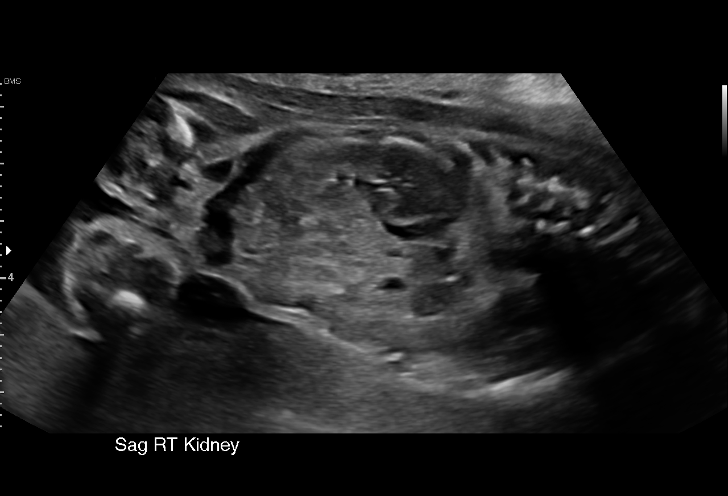
[im 49/66]
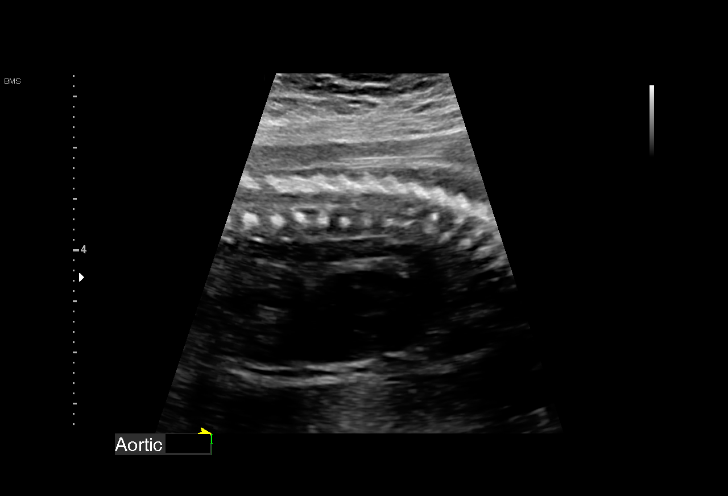
[im 53/66]
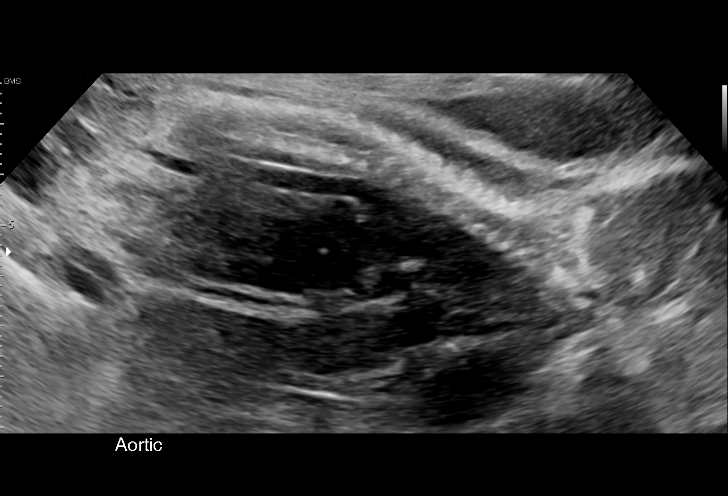
[im 58/66]
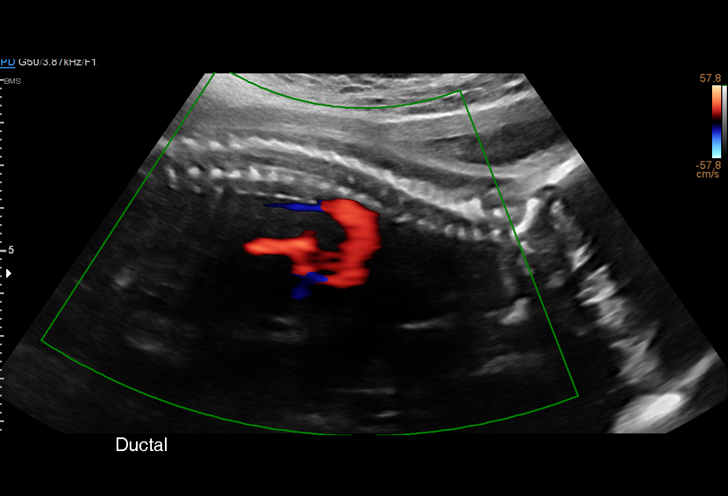
[im 63/66]
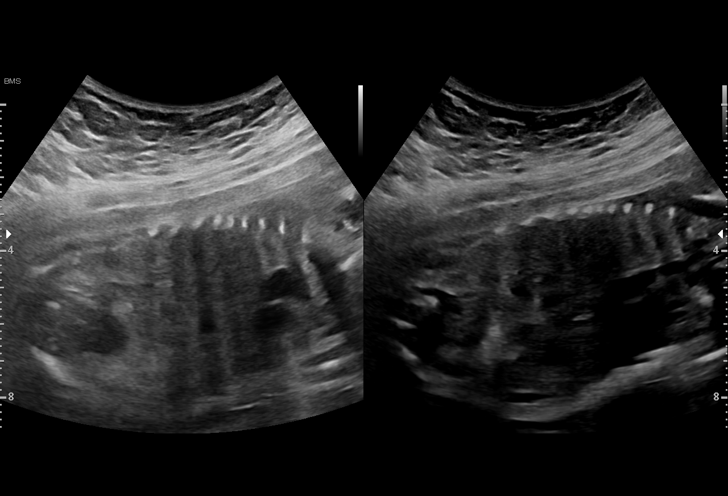

[13 of 28 positions shown; findings below may reference images not displayed]

Indications

 Antenatal screening for elevated
 alphafetoprotein level (AFP) 2.6 MoM
 Encounter for antenatal screening for
 malformations
 LR NIPS, Neg Horizon
 Echogenic intracardiac focus of the heart
 (EIF)
 Marginal insertion of umbilical cord affecting
 management of mother in second trimester
 25 weeks gestation of pregnancy
Fetal Evaluation

 Num Of Fetuses:         1
 Fetal Heart Rate(bpm):  140
 Cardiac Activity:       Observed
 Presentation:           Cephalic
 Placenta:               Posterior
 P. Cord Insertion:      Marginal insertion

 Amniotic Fluid
 AFI FV:      Within normal limits

                             Largest Pocket(cm)

Biometry

 BPD:      61.6  mm     G. Age:  25w 0d         23  %    CI:        80.87   %    70 - 86
                                                         FL/HC:      19.3   %    18.6 -
 HC:      216.3  mm     G. Age:  23w 5d        < 1  %    HC/AC:      1.15        1.04 -
 AC:      187.8  mm     G. Age:  23w 4d        2.8  %    FL/BPD:     67.7   %    71 - 87
 FL:       41.7  mm     G. Age:  23w 4d          2  %    FL/AC:      22.2   %    20 - 24
 HUM:      37.1  mm     G. Age:  23w 0d        < 5  %
 CER:      28.1  mm     G. Age:  25w 0d         32  %

 LV:        5.4  mm
 CM:          5  mm

 Est. FW:     614  gm      1 lb 6 oz    1.3  %
OB History

 Gravidity:    1
 Living:       0
Gestational Age

 LMP:           25w 4d        Date:  01/01/21                 EDD:   10/08/21
 U/S Today:     24w 0d                                        EDD:   10/19/21
 Best:          25w 4d     Det. By:  LMP  (01/01/21)          EDD:   10/08/21
Anatomy

 Cranium:               Previously seen        Aortic Arch:            Appears normal
 Cavum:                 Previously seen        Ductal Arch:            Appears normal
 Ventricles:            Appears normal         Diaphragm:              Appears normal
 Choroid Plexus:        Previously seen        Stomach:                Appears normal, left
                                                                       sided
 Cerebellum:            Appears normal         Abdomen:                Appears normal
 Posterior Fossa:       Appears normal         Abdominal Wall:         Not well visualized
 Nuchal Fold:           Not applicable (>20    Cord Vessels:           Previously seen
                        wks GA)
 Face:                  Orbits and profile     Kidneys:                Appear normal
                        previously seen
 Lips:                  Previously seen        Bladder:                Appears normal
 Thoracic:              Previously seen        Spine:                  Previously seen
 Heart:                 Not well visualized    Upper Extremities:      Previously seen
                        EIF
 RVOT:                  Previously seen        Lower Extremities:      Appears normal
 LVOT:                  Appears normal

 Other:  Heels and 5th digit not well visualized. VC, 3VV and 3VTV not well
         visualized. Fetus appears to be female.Patient does not want to know
         the gender of the baby.
Doppler - Fetal Vessels

 Umbilical Artery
  S/D     %tile      RI    %tile      PI    %tile            ADFV    RDFV
   4.2       86    0.79       91    1.28       84               No      No

Cervix Uterus Adnexa
 Cervix
 Not visualized (advanced GA >20wks)

 Uterus
 No abnormality visualized.

 Right Ovary
 Within normal limits.

 Left Ovary
 Within normal limits.

 Cul De Sac
 No free fluid seen.

 Adnexa
 No abnormality visualized.
Impression

 Single intrauterine pregnancy here for a follow up growth due
 to small for gestational age with an elevated AFP and EIF.
 Normal anatomy with measurements less than dates due to
 IUGR EFW 1.3%
 There is good fetal movement and amniotic fluid volume
 The UA Dopplers are normal without evidence of AEDF or
 REDF.

 Previously Ms. Ricardocarmen declined diagnostic testing. She
 declined again today given today's findings of FGR.

 I discussed today's visit with a diagnosis of FGR. I explained
 that the etiology includes placental insufficiency, chronic
 disease, infection, aneuploidy and other genetic syndromes.
 She has a low risk NIPS and negative horizon as no
 additional risk factors for chronic disease. At this time I
 explained the diagnosis, evaluation and management to
 include on going fetal growth and weekly antenatal testing to
 include UA Dopplers.

 In addition, the bowel appeared prominent but not bright as
 bone. I discussed possible causes including infection and
 genetic syndromes. She opted to have TORCH titers drawn
 but will return later this week given our lab techs had left for
 the day.

 If the EFW < 3rd% persist or abnormal testing occurs, I
 recommend delivery at 37 weeks otherwise if all is normal
 consider delivery at 39 weeks.
Recommendations

 Follow up growth in 4 weeks
 Repeat UA Dopplers in 2 weeks.
 TORCH titers.

## 2022-09-20 ENCOUNTER — Telehealth: Payer: Self-pay

## 2022-09-20 DIAGNOSIS — O99345 Other mental disorders complicating the puerperium: Secondary | ICD-10-CM

## 2022-09-20 MED ORDER — SERTRALINE HCL 100 MG PO TABS: 100.0000 mg | ORAL_TABLET | Freq: Every day | ORAL | 1 refills | Status: DC

## 2022-09-20 NOTE — Telephone Encounter (Signed)
Refill request received. Sent refill for 30 days with message to call office for [facility] appointment.

## 2022-09-29 ENCOUNTER — Other Ambulatory Visit: Payer: Self-pay | Admitting: Family

## 2022-09-29 DIAGNOSIS — F418 Other specified anxiety disorders: Secondary | ICD-10-CM

## 2022-11-02 ENCOUNTER — Telehealth: Payer: Self-pay | Admitting: Family Medicine

## 2022-11-02 NOTE — Telephone Encounter (Signed)
Please call patient for TOC app. Once made send back for refill request.

## 2022-11-02 NOTE — Telephone Encounter (Signed)
Patient called to get the medication sertraline sertraline (ZOLOFT) 100 MG tablet to get refilled.

## 2022-11-02 NOTE — Telephone Encounter (Signed)
LVM for patient to call back and schedule

## 2022-11-02 NOTE — Telephone Encounter (Signed)
S/w pt scheduled TOC appointment

## 2022-11-04 ENCOUNTER — Other Ambulatory Visit: Payer: Self-pay | Admitting: Family

## 2022-11-04 ENCOUNTER — Other Ambulatory Visit: Payer: Self-pay

## 2022-11-04 DIAGNOSIS — O99345 Other mental disorders complicating the puerperium: Secondary | ICD-10-CM

## 2022-11-04 MED ORDER — SERTRALINE HCL 100 MG PO TABS: 100.0000 mg | ORAL_TABLET | Freq: Every day | ORAL | 1 refills | Status: DC

## 2022-11-04 NOTE — Telephone Encounter (Signed)
Refill sent in to pharmacy 

## 2022-11-09 ENCOUNTER — Encounter: Payer: Self-pay | Admitting: Nurse Practitioner

## 2022-11-09 ENCOUNTER — Ambulatory Visit: Payer: 59 | Admitting: Nurse Practitioner

## 2022-11-09 VITALS — BP 116/70 | HR 76 | Temp 98.3°F | Ht 67.0 in | Wt 199.8 lb

## 2022-11-09 DIAGNOSIS — O99345 Other mental disorders complicating the puerperium: Secondary | ICD-10-CM

## 2022-11-09 DIAGNOSIS — F418 Other specified anxiety disorders: Secondary | ICD-10-CM

## 2022-11-09 DIAGNOSIS — E559 Vitamin D deficiency, unspecified: Secondary | ICD-10-CM | POA: Insufficient documentation

## 2022-11-09 MED ORDER — SERTRALINE HCL 100 MG PO TABS: 100.0000 mg | ORAL_TABLET | Freq: Every day | ORAL | 1 refills | Status: DC

## 2022-11-09 NOTE — Assessment & Plan Note (Signed)
Stable. Patient will continue Zoloft '100mg'$  QD. Discussed with patient starting on Buspar 7.'5mg'$  twice daily for increased anxiety symptoms. Patient would like to think about it and do her own research before starting. Encouraged patient to contact me if this is something she would like to begin.

## 2022-11-09 NOTE — Progress Notes (Signed)
  [name], NP-C Phone: [phone]  Amy Harvey is a 31 y.o. female who presents today for transfer of care.  Postpartum Anxiety- Patient reports doing well on Zoloft. However she feels as though her anxiety isn't completely managed. States she still has a lot of anxiety. She stopped breastfeeding earlier this month which has helped some. She has not had any recent panic attacks.   Vit D def- Completed once weekly dosing at 50,000 IU now taking OTC 1,000 IU daily.    Social History   Tobacco Use  Smoking Status Never  Smokeless Tobacco Never    Current Outpatient Medications on File Prior to Visit  Medication Sig Dispense Refill   levonorgestrel (MIRENA, 52 MG,) 20 MCG/DAY IUD Provided by care center     No current facility-administered medications on file prior to visit.     ROS see history of present illness  Objective  Physical Exam Vitals:   11/09/22 1029  BP: 116/70  Pulse: 76  Temp: 98.3 F (36.8 C)  SpO2: 97%    BP Readings from Last 3 Encounters:  11/09/22 116/70  08/04/22 100/60  06/23/22 110/70   Wt Readings from Last 3 Encounters:  11/09/22 199 lb 12.8 oz (90.6 kg)  08/04/22 199 lb 6 oz (90.4 kg)  06/23/22 197 lb 8 oz (89.6 kg)    Physical Exam Constitutional:      General: She is not in acute distress.    Appearance: Normal appearance.  Cardiovascular:     Rate and Rhythm: Normal rate and regular rhythm.  Pulmonary:     Effort: Pulmonary effort is normal.     Breath sounds: Normal breath sounds.  Neurological:     Mental Status: She is alert.  Psychiatric:        Mood and Affect: Mood normal.        Behavior: Behavior normal.    Assessment/Plan: Please see individual problem list.  Postpartum anxiety Assessment & Plan: Stable. Patient will continue Zoloft '100mg'$  QD. Discussed with patient starting on Buspar 7.'5mg'$  twice daily for increased anxiety symptoms. Patient would like to think about it and do her own research before  starting. Encouraged patient to contact me if this is something she would like to begin.  Orders: -     Sertraline HCl; Take 1 tablet (100 mg total) by mouth daily.  Dispense: 90 tablet; Refill: 1  Vitamin D deficiency Assessment & Plan: Stable. Continue 1,000 IU once daily OTC.     Return in about 3 months (around 02/08/2023) for Anxiety.   [name], NP-C [place]

## 2022-11-09 NOTE — Telephone Encounter (Signed)
Per chart review tab pt saw [name] NP and sertraline rx was sent to pharmacy.

## 2022-11-09 NOTE — Telephone Encounter (Signed)
[place] Night - Client TELEPHONE ADVICE RECORD [facility]urse Patient Name: [facility] MES SMER Gender: Female DOB: [birth year] Age: 31 Y 5 M 18 D Return Phone Number: [phone] (Primary), [phone] (Secondary) Address: City/ State/ Zip: [place]  [address] Client [place] Night - Client Client Site [place] Provider Waunita Schooner- MD Contact Type Call Who Is Calling Patient / Member / Family / Caregiver Call Type Triage / Clinical Relationship To Patient Self Return Phone Number [phone] (Primary) Chief Complaint Prescription Refill or Medication Request (non symptomatic) Reason for Call Medication Question / Request Initial Comment Caller states she is calling for a refill, she has been trying for a couple of weeks. She is out and the pharmacy has not been able to contact the office. The medication is Sertraline. No current symptoms. Translation No Nurse Assessment Nurse: Janene Madeira, RN, Wells Guiles Date/Time (Eastern Time): 11/05/2022 6:23:46 PM Confirm and document reason for call. If symptomatic, describe symptoms. ---Caller states she is calling for a refill, she has been trying for a couple of weeks. She is out and the pharmacy has not been able to contact the office. The medication is Sertraline '100mg'$  . No current symptoms. [phone] Does the patient have any new or worsening symptoms? ---No Nurse: Janene Madeira, RN, Wells Guiles Date/Time (Eastern Time): 11/05/2022 6:27:54 PM Please select the assessment type ---Refill Additional Documentation ---Caller states she is calling for a refill, she has been trying for a couple of weeks. She is out and the pharmacy has not been able to contact the office. The medication is Sertraline '100mg'$  . No current symptoms. [phone] Does the patient have enough medication to last until the office opens?  ---No Additional Documentation ---Caller advised that this medication issue will need to be taken care of during business hours. Advised that message will be sent to the office and that they should also follow up with the office when they open back up. Verbalizes understanding and denies further questions or concerns. Advised to call back with further questions or concerns. PLEASE NOTE: All timestamps contained within this report are represented as [place] Standard Time. CONFIDENTIALTY NOTICE: This fax transmission is intended only for the addressee. It contains information that is legally privileged, confidential or otherwise protected from use or disclosure. If you are not the intended recipient, you are strictly prohibited from reviewing, disclosing, copying using or disseminating any of this information or taking any action in reliance on or regarding this information. If you have received this fax in error, please notify us immediately by telephone so that we can arrange for its return to [place]. Phone: [phone], Toll-Free: [phone], Fax: [phone] Page: 2 of 2 Call Id: [id number] [place]. Time Eilene Ghazi Time) Disposition Final User 11/05/2022 5:56:11 PM Send To Nurse Graylon Gunning, RN, Rhonda 11/05/2022 6:29:34 PM Pharmacy Call Janene Madeira, RN, Wells Guiles Reason: obtaining a loaner dose from the pharmacy 11/05/2022 6:33:33 PM Clinical Call Yes Janene Madeira, RN, Wells Guiles Final Disposition 11/05/2022 6:33:33 PM Clinical Call Yes [place], RN, Rebe[place]

## 2022-11-09 NOTE — Assessment & Plan Note (Signed)
Stable. Continue 1,000 IU once daily OTC.

## 2022-11-30 ENCOUNTER — Telehealth: Payer: Self-pay

## 2022-11-30 ENCOUNTER — Other Ambulatory Visit: Payer: Self-pay | Admitting: Nurse Practitioner

## 2022-11-30 DIAGNOSIS — F418 Other specified anxiety disorders: Secondary | ICD-10-CM

## 2022-11-30 MED ORDER — BUSPIRONE HCL 7.5 MG PO TABS: 7.5000 mg | ORAL_TABLET | Freq: Two times a day (BID) | ORAL | 0 refills | Status: DC

## 2022-11-30 NOTE — Telephone Encounter (Signed)
I called patient to reschedule her upcoming appointment with [name], MP.  Patient states when she saw [name], NP recently, she wanted to wait to start taking anxiety medication.  Patient states she is now ready to try this medication.  *Patient states [facility] is [facility] [address].

## 2022-11-30 NOTE — Telephone Encounter (Signed)
Pt informed.

## 2023-01-20 ENCOUNTER — Encounter: Payer: 59 | Admitting: Family

## 2023-02-08 ENCOUNTER — Ambulatory Visit: Payer: 59 | Admitting: Nurse Practitioner

## 2023-02-08 ENCOUNTER — Ambulatory Visit: Payer: Self-pay | Admitting: Nurse Practitioner

## 2023-02-08 NOTE — Progress Notes (Deleted)
  [name], NP-C Phone: [phone]  Amy Harvey is a 32 y.o. female who presents today for follow up.   Anxiety/Depression-  Social History   Tobacco Use  Smoking Status Never  Smokeless Tobacco Never    Current Outpatient Medications on File Prior to Visit  Medication Sig Dispense Refill   busPIRone (BUSPAR) 7.5 MG tablet Take 1 tablet (7.5 mg total) by mouth 2 (two) times daily. 180 tablet 0   levonorgestrel (MIRENA, 52 MG,) 20 MCG/DAY IUD Provided by care center     sertraline (ZOLOFT) 100 MG tablet Take 1 tablet (100 mg total) by mouth daily. 90 tablet 1   No current facility-administered medications on file prior to visit.     ROS see history of present illness  Objective  Physical Exam There were no vitals filed for this visit.  BP Readings from Last 3 Encounters:  11/09/22 116/70  08/04/22 100/60  06/23/22 110/70   Wt Readings from Last 3 Encounters:  11/09/22 199 lb 12.8 oz (90.6 kg)  08/04/22 199 lb 6 oz (90.4 kg)  06/23/22 197 lb 8 oz (89.6 kg)    Physical Exam   Assessment/Plan: Please see individual problem list.  There are no diagnoses linked to this encounter.   Health Maintenance: ***  No follow-ups on file.   [name], NP-C [place]

## 2023-02-10 ENCOUNTER — Ambulatory Visit: Payer: 59 | Admitting: Nurse Practitioner

## 2023-02-10 VITALS — BP 108/70 | HR 78 | Temp 98.1°F | Ht 67.0 in | Wt 198.4 lb

## 2023-02-10 DIAGNOSIS — L700 Acne vulgaris: Secondary | ICD-10-CM | POA: Diagnosis not present

## 2023-02-10 DIAGNOSIS — F4323 Adjustment disorder with mixed anxiety and depressed mood: Secondary | ICD-10-CM

## 2023-02-10 MED ORDER — CLINDAMYCIN PHOS-BENZOYL PEROX 1-5 % EX GEL: Freq: Two times a day (BID) | CUTANEOUS | 2 refills | Status: DC

## 2023-02-10 MED ORDER — BUSPIRONE HCL 7.5 MG PO TABS: 7.5000 mg | ORAL_TABLET | Freq: Two times a day (BID) | ORAL | 3 refills | Status: DC

## 2023-02-10 MED ORDER — SERTRALINE HCL 100 MG PO TABS: 100.0000 mg | ORAL_TABLET | Freq: Every day | ORAL | 1 refills | Status: DC

## 2023-02-10 NOTE — Assessment & Plan Note (Signed)
Will trial BenzaClin twice daily to see if patient gets relief. Discussed referral to Dermatology if acne continues to be painful and is not improving. Will monitor.

## 2023-02-10 NOTE — Assessment & Plan Note (Signed)
Stable on Buspar 7.5 mg BID and Zoloft 100 mg daily. Significant symptom improvement since starting Buspar. Continue. Refills sent. Will continue to monitor. Encouraged patient to contact if symptoms are changing or worsening.

## 2023-02-10 NOTE — Progress Notes (Signed)
  [name], NP-C Phone: [phone]  Amy Harvey is a 31 y.o. female who presents today for follow up.   Anxiety and Depression- Patient reports doing very well on medications. Currently taking Zoloft 100 mg daily and started on Buspar 7.5 mg BID in January. She reports the Buspar has greatly helped reduce her anxiety. She feels that her anxiety has decreased a considerable amount. She would like to continue both medications.   Acne- Reports worsening cystic and painful acne occurring on her face and the back of her neck, between her shoulders. Reports she has always had some acne but it has not been this bad or painful before. Reports it has started to improve some over the last few months. However she would like something that could help.    Social History   Tobacco Use  Smoking Status Never  Smokeless Tobacco Never    Current Outpatient Medications on File Prior to Visit  Medication Sig Dispense Refill   levonorgestrel (MIRENA, 52 MG,) 20 MCG/DAY IUD Provided by care center     No current facility-administered medications on file prior to visit.    ROS see history of present illness  Objective  Physical Exam Vitals:   02/10/23 1504  BP: 108/70  Pulse: 78  Temp: 98.1 F (36.7 C)  SpO2: 98%    BP Readings from Last 3 Encounters:  02/10/23 108/70  11/09/22 116/70  08/04/22 100/60   Wt Readings from Last 3 Encounters:  02/10/23 198 lb 6.4 oz (90 kg)  11/09/22 199 lb 12.8 oz (90.6 kg)  08/04/22 199 lb 6 oz (90.4 kg)    Physical Exam Constitutional:      General: She is not in acute distress.    Appearance: Normal appearance.  HENT:     Head: Normocephalic.  Cardiovascular:     Rate and Rhythm: Normal rate and regular rhythm.     Heart sounds: Normal heart sounds.  Pulmonary:     Effort: Pulmonary effort is normal.     Breath sounds: Normal breath sounds.  Skin:    General: Skin is warm and dry.  Neurological:     General: No focal deficit  present.     Mental Status: She is alert.  Psychiatric:        Mood and Affect: Mood normal.        Behavior: Behavior normal.    Assessment/Plan: Please see individual problem list.  Adjustment reaction with anxiety and depression Assessment & Plan: Stable on Buspar 7.5 mg BID and Zoloft 100 mg daily. Significant symptom improvement since starting Buspar. Continue. Refills sent. Will continue to monitor. Encouraged patient to contact if symptoms are changing or worsening.   Orders: -     busPIRone HCl; Take 1 tablet (7.5 mg total) by mouth 2 (two) times daily.  Dispense: 180 tablet; Refill: 3 -     Sertraline HCl; Take 1 tablet (100 mg total) by mouth daily.  Dispense: 90 tablet; Refill: 1  Cystic acne vulgaris Assessment & Plan: Will trial BenzaClin twice daily to see if patient gets relief. Discussed referral to Dermatology if acne continues to be painful and is not improving. Will monitor.   Orders: -     Clindamycin Phos-Benzoyl Perox; Apply topically 2 (two) times daily.  Dispense: 35 g; Refill: 2   Return in about 6 months (around 08/13/2023).   [name], NP-C [place]

## 2023-03-01 NOTE — Progress Notes (Deleted)
  Bethanie Dicker, NP-C Phone: [phone]  Amy Harvey is a 32 y.o. female who presents today for ***  Insect Bite-   Social History   Tobacco Use  Smoking Status Never  Smokeless Tobacco Never    Current Outpatient Medications on File Prior to Visit  Medication Sig Dispense Refill   busPIRone (BUSPAR) 7.5 MG tablet Take 1 tablet (7.5 mg total) by mouth 2 (two) times daily. 180 tablet 3   clindamycin-benzoyl peroxide (BENZACLIN) gel Apply topically 2 (two) times daily. 35 g 2   levonorgestrel (MIRENA, 52 MG,) 20 MCG/DAY IUD Provided by care center     [START ON 05/11/2023] sertraline (ZOLOFT) 100 MG tablet Take 1 tablet (100 mg total) by mouth daily. 90 tablet 1   No current facility-administered medications on file prior to visit.     ROS see history of present illness  Objective  Physical Exam There were no vitals filed for this visit.  BP Readings from Last 3 Encounters:  02/10/23 108/70  11/09/22 116/70  08/04/22 100/60   Wt Readings from Last 3 Encounters:  02/10/23 198 lb 6.4 oz (90 kg)  11/09/22 199 lb 12.8 oz (90.6 kg)  08/04/22 199 lb 6 oz (90.4 kg)    Physical Exam   Assessment/Plan: Please see individual problem list.  There are no diagnoses linked to this encounter.   Health Maintenance: ***  No follow-ups on file.   Bethanie Dicker, NP-C [facility] [employer]

## 2023-03-02 ENCOUNTER — Ambulatory Visit: Payer: 59 | Admitting: Nurse Practitioner

## 2023-03-10 ENCOUNTER — Other Ambulatory Visit: Payer: Self-pay

## 2023-03-10 DIAGNOSIS — F4323 Adjustment disorder with mixed anxiety and depressed mood: Secondary | ICD-10-CM

## 2023-03-11 MED ORDER — SERTRALINE HCL 100 MG PO TABS: 100.0000 mg | ORAL_TABLET | Freq: Every day | ORAL | 1 refills | Status: DC

## 2023-03-23 ENCOUNTER — Ambulatory Visit: Payer: 59 | Admitting: Dermatology

## 2023-03-23 VITALS — BP 116/70 | HR 64

## 2023-03-23 DIAGNOSIS — L821 Other seborrheic keratosis: Secondary | ICD-10-CM | POA: Diagnosis not present

## 2023-03-23 DIAGNOSIS — D1801 Hemangioma of skin and subcutaneous tissue: Secondary | ICD-10-CM

## 2023-03-23 DIAGNOSIS — D2321 Other benign neoplasm of skin of right ear and external auricular canal: Secondary | ICD-10-CM

## 2023-03-23 DIAGNOSIS — X32XXXA Exposure to sunlight, initial encounter: Secondary | ICD-10-CM

## 2023-03-23 DIAGNOSIS — Z1283 Encounter for screening for malignant neoplasm of skin: Secondary | ICD-10-CM | POA: Diagnosis not present

## 2023-03-23 DIAGNOSIS — Z86018 Personal history of other benign neoplasm: Secondary | ICD-10-CM

## 2023-03-23 DIAGNOSIS — L814 Other melanin hyperpigmentation: Secondary | ICD-10-CM

## 2023-03-23 DIAGNOSIS — D229 Melanocytic nevi, unspecified: Secondary | ICD-10-CM

## 2023-03-23 DIAGNOSIS — W908XXA Exposure to other nonionizing radiation, initial encounter: Secondary | ICD-10-CM

## 2023-03-23 DIAGNOSIS — L578 Other skin changes due to chronic exposure to nonionizing radiation: Secondary | ICD-10-CM

## 2023-03-23 NOTE — Progress Notes (Signed)
   Follow-Up Visit   Subjective  Amy Harvey is a 32 y.o. female who presents for the following: Skin Cancer Screening and Full Body Skin Exam, hx of Dysplastic nevus.  The patient presents for Total-Body Skin Exam (TBSE) for skin cancer screening and mole check. The patient has spots, moles and lesions to be evaluated, some may be new or changing and the patient has concerns that these could be cancer.  The following portions of the chart were reviewed this encounter and updated as appropriate: medications, allergies, medical history  Review of Systems:  No other skin or systemic complaints except as noted in HPI or Assessment and Plan.  Objective  Well appearing patient in no apparent distress; mood and affect are within normal limits.  A full examination was performed including scalp, head, eyes, ears, nose, lips, neck, chest, axillae, abdomen, back, buttocks, bilateral upper extremities, bilateral lower extremities, hands, feet, fingers, toes, fingernails, and toenails. All findings within normal limits unless otherwise noted below.   Relevant physical exam findings are noted in the Assessment and Plan.  right superior ear 0.2 cm brown macule       Assessment & Plan   LENTIGINES, SEBORRHEIC KERATOSES, HEMANGIOMAS - Benign normal skin lesions - Benign-appearing - Call for any changes  MELANOCYTIC NEVI - Tan-brown and/or pink-flesh-colored symmetric macules and papules - Benign appearing on exam today - Observation - Call clinic for new or changing moles - Recommend daily use of broad spectrum spf 30+ sunscreen to sun-exposed areas.   HISTORY OF DYSPLASTIC NEVUS Multiple see history  No evidence of recurrence today Recommend regular full body skin exams Recommend daily broad spectrum sunscreen SPF 30+ to sun-exposed areas, reapply every 2 hours as needed.  Call if any new or changing lesions are noted between office visits   SKIN CANCER SCREENING PERFORMED  TODAY.  Nevus right superior ear See photo Benign-appearing.  Observation.  Call clinic for new or changing lesions.  Recommend daily use of broad spectrum spf 30+ sunscreen to sun-exposed areas.   ACTINIC DAMAGE - chronic, secondary to cumulative UV radiation exposure/sun exposure over time - diffuse scaly erythematous macules with underlying dyspigmentation - Recommend daily broad spectrum sunscreen SPF 30+ to sun-exposed areas, reapply every 2 hours as needed.  - Recommend staying in the shade or wearing long sleeves, sun glasses (UVA+UVB protection) and wide brim hats (4-inch brim around the entire circumference of the hat). - Call for new or changing lesions.  Return in about 1 year (around 03/22/2024) for TBSE, hx of Dysplastic nevus .  IAngelique Holm, CMA, am acting as scribe for Armida Sans, MD .   Documentation: I have reviewed the above documentation for accuracy and completeness, and I agree with the above.  Armida Sans, MD

## 2023-03-23 NOTE — Patient Instructions (Addendum)
Recommend daily broad spectrum sunscreen SPF 30+ to sun-exposed areas, reapply every 2 hours as needed. Call for new or changing lesions.  Staying in the shade or wearing long sleeves, sun glasses (UVA+UVB protection) and wide brim hats (4-inch brim around the entire circumference of the hat) are also recommended for sun protection.    Due to recent changes in healthcare laws, you may see results of your pathology and/or laboratory studies on MyChart before the doctors have had a chance to review them. We understand that in some cases there may be results that are confusing or concerning to you. Please understand that not all results are received at the same time and often the doctors may need to interpret multiple results in order to provide you with the best plan of care or course of treatment. Therefore, we ask that you please give us 2 business days to thoroughly review all your results before contacting the office for clarification. Should we see a critical lab result, you will be contacted sooner.   If You Need Anything After Your Visit  If you have any questions or concerns for your doctor, please call our main line at [phone] and press option 4 to reach your doctor's medical assistant. If no one answers, please leave a voicemail as directed and we will return your call as soon as possible. Messages left after 4 pm will be answered the following business day.   You may also send us a message via MyChart. We typically respond to MyChart messages within 1-2 business days.  For prescription refills, please ask your pharmacy to contact our office. Our fax number is [phone].  If you have an urgent issue when the clinic is closed that cannot wait until the next business day, you can page your doctor at the number below.    Please note that while we do our best to be available for urgent issues outside of office hours, we are not available 24/7.   If you have an urgent issue and are  unable to reach us, you may choose to seek medical care at your doctor's office, retail clinic, urgent care center, or emergency room.  If you have a medical emergency, please immediately call 911 or go to the emergency department.  Pager Numbers  - Dr. Kowalski: [phone]  - Dr. Moye: [phone]  - Dr. Stewart: [phone]  In the event of inclement weather, please call our main line at [phone] for an update on the status of any delays or closures.  Dermatology Medication Tips: Please keep the boxes that topical medications come in in order to help keep track of the instructions about where and how to use these. Pharmacies typically print the medication instructions only on the boxes and not directly on the medication tubes.   If your medication is too expensive, please contact our office at [phone] option 4 or send us a message through MyChart.   We are unable to tell what your co-pay for medications will be in advance as this is different depending on your insurance coverage. However, we may be able to find a substitute medication at lower cost or fill out paperwork to get insurance to cover a needed medication.   If a prior authorization is required to get your medication covered by your insurance company, please allow us 1-2 business days to complete this process.  Drug prices often vary depending on where the prescription is filled and some pharmacies may offer cheaper prices.  The website www.goodrx.com   contains coupons for medications through different pharmacies. The prices here do not account for what the cost may be with help from insurance (it may be cheaper with your insurance), but the website can give you the price if you did not use any insurance.  - You can print the associated coupon and take it with your prescription to the pharmacy.  - You may also stop by our office during regular business hours and pick up a GoodRx coupon card.  - If you need your  prescription sent electronically to a different pharmacy, notify our office through [facility] MyChart or by phone at [phone] option 4.     Si Usted Necesita Algo Despus de Su Visita  Tambin puede enviarnos un mensaje a travs de MyChart. Por lo general respondemos a los mensajes de MyChart en el transcurso de 1 a 2 das hbiles.  Para renovar recetas, por favor pida a su farmacia que se ponga en contacto con nuestra oficina. Nuestro nmero de fax es el [phone].  Si tiene un asunto urgente cuando la clnica est cerrada y que no puede esperar hasta el siguiente da hbil, puede llamar/localizar a su doctor(a) al nmero que aparece a continuacin.   Por favor, tenga en cuenta que aunque hacemos todo lo posible para estar disponibles para asuntos urgentes fuera del horario de oficina, no estamos disponibles las 24 horas del da, los 7 das de la semana.   Si tiene un problema urgente y no puede comunicarse con nosotros, puede optar por buscar atencin mdica  en el consultorio de su doctor(a), en una clnica privada, en un centro de atencin urgente o en una sala de emergencias.  Si tiene una emergencia mdica, por favor llame inmediatamente al 911 o vaya a la sala de emergencias.  Nmeros de bper  - Dr. Kowalski: [phone]  - Dra. Moye: [phone]  - Dra. Stewart: [phone]  En caso de inclemencias del tiempo, por favor llame a nuestra lnea principal al [phone] para una actualizacin sobre el estado de cualquier retraso o cierre.  Consejos para la medicacin en dermatologa: Por favor, guarde las cajas en las que vienen los medicamentos de uso tpico para ayudarle a seguir las instrucciones sobre dnde y cmo usarlos. Las farmacias generalmente imprimen las instrucciones del medicamento slo en las cajas y no directamente en los tubos del medicamento.   Si su medicamento es muy caro, por favor, pngase en contacto con nuestra oficina llamando al [phone]  y presione la opcin 4 o envenos un mensaje a travs de MyChart.   No podemos decirle cul ser su copago por los medicamentos por adelantado ya que esto es diferente dependiendo de la cobertura de su seguro. Sin embargo, es posible que podamos encontrar un medicamento sustituto a menor costo o llenar un formulario para que el seguro cubra el medicamento que se considera necesario.   Si se requiere una autorizacin previa para que su compaa de seguros cubra su medicamento, por favor permtanos de 1 a 2 das hbiles para completar este proceso.  Los precios de los medicamentos varan con frecuencia dependiendo del lugar de dnde se surte la receta y alguna farmacias pueden ofrecer precios ms baratos.  El sitio web www.goodrx.com tiene cupones para medicamentos de diferentes farmacias. Los precios aqu no tienen en cuenta lo que podra costar con la ayuda del seguro (puede ser ms barato con su seguro), pero el sitio web puede darle el precio si no utiliz ningn seguro.  - Puede imprimir el   cupn correspondiente y llevarlo con su receta a la farmacia.  - Tambin puede pasar por nuestra oficina durante el horario de atencin regular y recoger una tarjeta de cupones de GoodRx.  - Si necesita que su receta se enve electrnicamente a una farmacia diferente, informe a nuestra oficina a travs de MyChart de [place] o por telfono llamando al [phone] y presione la opcin 4.  

## 2023-03-29 ENCOUNTER — Encounter: Payer: Self-pay | Admitting: Dermatology

## 2023-04-06 ENCOUNTER — Ambulatory Visit: Payer: 59 | Admitting: Podiatry

## 2023-04-06 ENCOUNTER — Ambulatory Visit (INDEPENDENT_AMBULATORY_CARE_PROVIDER_SITE_OTHER): Payer: 59

## 2023-04-06 DIAGNOSIS — M84375A Stress fracture, left foot, initial encounter for fracture: Secondary | ICD-10-CM

## 2023-04-06 DIAGNOSIS — M7752 Other enthesopathy of left foot: Secondary | ICD-10-CM

## 2023-04-06 DIAGNOSIS — M7750 Other enthesopathy of unspecified foot: Secondary | ICD-10-CM

## 2023-04-06 DIAGNOSIS — M21622 Bunionette of left foot: Secondary | ICD-10-CM | POA: Diagnosis not present

## 2023-04-06 NOTE — Progress Notes (Signed)
  Subjective:  Patient ID: Amy Harvey, female    DOB: 1990/11/19,  MRN: [id number]  Chief Complaint  Patient presents with   Foot Pain    New pt-pain left foot on the outside area getting worse    32 y.o. female presents with the above complaint. History confirmed with patient.  She has a 78-month-old daughter and is on her feet quite a bit taking care of her especially tends to put weight on this foot more so when holding her  Objective:  Physical Exam: warm, good capillary refill, no trophic changes or ulcerative lesions, normal DP and PT pulses, and normal sensory exam.  Slight tenderness to palpation distal fifth metatarsal dorsally and laterally, no bruising ecchymosis or instability   Radiographs: Multiple views x-ray of the left foot: no fracture, dislocation, swelling or degenerative changes noted Assessment:   1. Stress reaction of left foot, initial encounter   2. Tailor's bunionette, left      Plan:  Patient was evaluated and treated and all questions answered.  We reviewed her radiographs.  There is no direct evidence of developing stress fracture.  She may have a stress reaction developing due to weightbearing and a slight tailor's bunion deformity as well.  We discussed appropriate shoe gear.  Her current shoes are losing support and had quite a bit of flexibility and loss of support through the plantar sole.  We discussed appropriate shoe gear and she will look at replacing these, we also discussed prefabricated foot orthoses as well.  She will return to see me.  If this is worsening or not improving discussed RICE protocol and advised to utilize NSAIDs as needed and ice as needed.  Return if symptoms worsen or fail to improve.

## 2023-04-06 NOTE — Patient Instructions (Signed)
Shoe brands I like: -Altra -New Balance -Hoka  Look for a neutral shoe with maximum cushioning   Insert/Orthotics I like: -Powersteps (we have these here at the office for $55) -Superfeet -Protalus

## 2023-04-22 ENCOUNTER — Ambulatory Visit: Payer: 59 | Admitting: Nurse Practitioner

## 2023-04-22 VITALS — BP 110/80 | HR 56 | Temp 98.0°F | Ht 67.0 in | Wt 209.6 lb

## 2023-04-22 DIAGNOSIS — F4323 Adjustment disorder with mixed anxiety and depressed mood: Secondary | ICD-10-CM

## 2023-04-22 DIAGNOSIS — R2242 Localized swelling, mass and lump, left lower limb: Secondary | ICD-10-CM

## 2023-04-22 NOTE — Progress Notes (Unsigned)
Bethanie Dicker, NP-C Phone: [phone]  Amy Harvey is a 32 y.o. female who presents today for lump on leg.  Patient noticed a lump on her anterior left thigh several days ago. It has not changed in shape or size. Denies pain. She does have some mild discomfort and pressure with palpation of the area. It is deep under the skin. Denies any recent injury. She is wanting an ultrasound of the area for further evaluation.  Anxiety/Depression- Increased life stressors recently causing increased anxiety. She is taking Zoloft 100 mg daily and Buspar 7.5 mg twice daily. She is interested in increasing her Buspar. Denies SI/HI.  Social History   Tobacco Use  Smoking Status Never  Smokeless Tobacco Never    Current Outpatient Medications on File Prior to Visit  Medication Sig Dispense Refill   clotrimazole-betamethasone (LOTRISONE) cream APPLY TO AFFECTED EAR ONCE OR TWICE WEEKLY AS NEEDED FOR ITCHING, IRRITATION     levonorgestrel (MIRENA, 52 MG,) 20 MCG/DAY IUD Provided by care center     [START ON 05/11/2023] sertraline (ZOLOFT) 100 MG tablet Take 1 tablet (100 mg total) by mouth daily. 90 tablet 1   spironolactone (ALDACTONE) 25 MG tablet Take 1 tablet by mouth daily.     No current facility-administered medications on file prior to visit.    ROS see history of present illness  Objective  Physical Exam Vitals:   04/22/23 1019  BP: 110/80  Pulse: (!) 56  Temp: 98 F (36.7 C)  SpO2: 97%    BP Readings from Last 3 Encounters:  04/22/23 110/80  03/23/23 116/70  02/10/23 108/70   Wt Readings from Last 3 Encounters:  04/22/23 209 lb 9.6 oz (95.1 kg)  02/10/23 198 lb 6.4 oz (90 kg)  11/09/22 199 lb 12.8 oz (90.6 kg)    Physical Exam Constitutional:      General: She is not in acute distress.    Appearance: Normal appearance.  HENT:     Head: Normocephalic.  Cardiovascular:     Rate and Rhythm: Normal rate and regular rhythm.     Heart sounds: Normal heart sounds.   Pulmonary:     Effort: Pulmonary effort is normal.     Breath sounds: Normal breath sounds.  Skin:    General: Skin is warm and dry.     Findings: No bruising, erythema, lesion or rash.          Comments: Area of concern palpated- no mass or lump noted.   Neurological:     General: No focal deficit present.     Mental Status: She is alert.  Psychiatric:        Mood and Affect: Mood normal.        Behavior: Behavior normal.    Assessment/Plan: Please see individual problem list.  Skin lump of leg, left Assessment & Plan: No mass or lump palpated on exam. Will get ultrasound per patient's request for further evaluation and contact her with the results. Likely fatty tissue. Return precautions given to patient.   Orders: -     [place] LT LOWER EXTREM LTD SOFT TISSUE NON VASCULAR; Future  Adjustment reaction with anxiety and depression Assessment & Plan: Recent increase in anxiety due to life stressors. Will increase Buspar to 10 mg twice daily. She will continue Zoloft 100 mg daily. Encouraged to contact if worsening symptoms, unusual behavior changes or suicidal thoughts occur. Will continue to monitor.  Orders: -     busPIRone HCl; Take 1 tablet (10 mg  total) by mouth 2 (two) times daily.  Dispense: 180 tablet; Refill: 1    Return in 4 months (on 08/16/2023) for follow up as scheduled, sooner PRN.   Bethanie Dicker, NP-C [facility] - [employer]

## 2023-04-25 ENCOUNTER — Other Ambulatory Visit: Payer: Self-pay | Admitting: Internal Medicine

## 2023-04-25 DIAGNOSIS — R1900 Intra-abdominal and pelvic swelling, mass and lump, unspecified site: Secondary | ICD-10-CM

## 2023-04-25 DIAGNOSIS — R2242 Localized swelling, mass and lump, left lower limb: Secondary | ICD-10-CM | POA: Insufficient documentation

## 2023-04-25 MED ORDER — BUSPIRONE HCL 10 MG PO TABS: 10.0000 mg | ORAL_TABLET | Freq: Two times a day (BID) | ORAL | 1 refills | Status: DC

## 2023-04-27 NOTE — Assessment & Plan Note (Addendum)
Recent increase in anxiety due to life stressors. Will increase Buspar to 10 mg twice daily. She will continue Zoloft 100 mg daily. Encouraged to contact if worsening symptoms, unusual behavior changes or suicidal thoughts occur. Will continue to monitor.

## 2023-04-27 NOTE — Assessment & Plan Note (Signed)
No mass or lump palpated on exam. Will get ultrasound per patient's request for further evaluation and contact her with the results. Likely fatty tissue. Return precautions given to patient.

## 2023-05-03 ENCOUNTER — Other Ambulatory Visit: Payer: Self-pay | Admitting: Obstetrics and Gynecology

## 2023-05-03 ENCOUNTER — Ambulatory Visit: Payer: 59

## 2023-05-03 DIAGNOSIS — R102 Pelvic and perineal pain: Secondary | ICD-10-CM

## 2023-05-04 ENCOUNTER — Encounter: Payer: 59 | Admitting: Family Medicine

## 2023-05-10 ENCOUNTER — Ambulatory Visit
Admission: RE | Admit: 2023-05-10 | Discharge: 2023-05-10 | Disposition: A | Discharge status: Home / Self Care | Payer: 59 | Source: Ambulatory Visit | Attending: Nurse Practitioner | Admitting: Nurse Practitioner

## 2023-05-10 DIAGNOSIS — R2242 Localized swelling, mass and lump, left lower limb: Secondary | ICD-10-CM | POA: Insufficient documentation

## 2023-05-11 ENCOUNTER — Ambulatory Visit: Payer: 59 | Admitting: Nurse Practitioner

## 2023-05-25 ENCOUNTER — Ambulatory Visit
Admission: RE | Admit: 2023-05-25 | Discharge: 2023-05-25 | Disposition: A | Discharge status: Home / Self Care | Payer: 59 | Source: Ambulatory Visit | Attending: Obstetrics and Gynecology | Admitting: Obstetrics and Gynecology

## 2023-05-25 DIAGNOSIS — R102 Pelvic and perineal pain: Secondary | ICD-10-CM

## 2023-05-25 MED ORDER — IOPAMIDOL (ISOVUE-300) INJECTION 61%
100.0000 mL | Freq: Once | INTRAVENOUS | Status: AC | PRN
  Administered 2023-05-25: 100 mL via INTRAVENOUS

## 2023-06-24 ENCOUNTER — Telehealth: Payer: 59 | Admitting: Nurse Practitioner

## 2023-06-24 ENCOUNTER — Telehealth: Payer: Self-pay | Admitting: Nurse Practitioner

## 2023-06-24 ENCOUNTER — Encounter: Payer: Self-pay | Admitting: Nurse Practitioner

## 2023-06-24 DIAGNOSIS — R61 Generalized hyperhidrosis: Secondary | ICD-10-CM | POA: Diagnosis not present

## 2023-06-24 NOTE — Progress Notes (Unsigned)
   Virtual Visit via Video Note  I connected with Amy Harvey on 06/24/2023 at 4:36 PM by a video enabled telemedicine application and verified that I am speaking with the correct person using two identifiers.  Patient Location: Home Provider Location: Office/Clinic  I discussed the limitations, risks, security, and privacy concerns of performing an evaluation and management service by video and the availability of in person appointments. I also discussed with the patient that there may be a patient responsible charge related to this service. The patient expressed understanding and agreed to proceed.  Subjective: PCP: Amy Dicker, NP  Chief Complaint  Patient presents with   Acute Visit    Sertraline is making the Patient sweat profusely even inside to the point people are asking if she is ok?    HPI Patient is seen due to excessive sweating.  Patient states that she thinks the excessive sweating is side effect of Zoloft.  She states that it is impacting her day-to-day activities.  She would like to stop thinking on decrease the dose of the medication.  ROS: Per HPI  Current Outpatient Medications:    busPIRone (BUSPAR) 10 MG tablet, Take 1 tablet (10 mg total) by mouth 2 (two) times daily., Disp: 180 tablet, Rfl: 1   levonorgestrel (MIRENA, 52 MG,) 20 MCG/DAY IUD, Provided by care center, Disp: , Rfl:    sertraline (ZOLOFT) 100 MG tablet, Take 1 tablet (100 mg total) by mouth daily., Disp: 90 tablet, Rfl: 1   spironolactone (ALDACTONE) 25 MG tablet, Take 1 tablet by mouth daily., Disp: , Rfl:   Observations/Objective: There were no vitals filed for this visit. Physical Exam Constitutional:      General: She is not in acute distress.    Appearance: Normal appearance. She is not ill-appearing.  Eyes:     Conjunctiva/sclera: Conjunctivae normal.  Pulmonary:     Effort: No respiratory distress.  Neurological:     Mental Status: She is alert.  Psychiatric:        Mood  and Affect: Mood normal.        Behavior: Behavior normal.        Thought Content: Thought content normal.        Judgment: Judgment normal.     Assessment and Plan: Hyperhidrosis Assessment & Plan: Discussed with patient that excessive sweating is one of the side effect with Zoloft. Advised patient to decrease the dose from 100 mg to 50 mg daily and follow-up with PCP in 2 weeks.     Follow Up Instructions: Return in about 2 weeks (around 07/08/2023).   I discussed the assessment and treatment plan with the patient. The patient was provided an opportunity to ask questions, and all were answered. The patient agreed with the plan and demonstrated an understanding of the instructions.   The patient was advised to call back or seek an in-person evaluation if the symptoms worsen or if the condition fails to improve as anticipated.  The above assessment and management plan was discussed with the patient. The patient verbalized understanding of and has agreed to the management plan.   Kara Dies, NP

## 2023-06-24 NOTE — Telephone Encounter (Signed)
Error

## 2023-06-27 DIAGNOSIS — R61 Generalized hyperhidrosis: Secondary | ICD-10-CM | POA: Insufficient documentation

## 2023-06-27 NOTE — Assessment & Plan Note (Signed)
Discussed with patient that excessive sweating is one of the side effect with Zoloft. Advised patient to decrease the dose from 100 mg to 50 mg daily and follow-up with PCP in 2 weeks.

## 2023-07-08 ENCOUNTER — Telehealth (INDEPENDENT_AMBULATORY_CARE_PROVIDER_SITE_OTHER): Payer: 59 | Admitting: Nurse Practitioner

## 2023-07-08 ENCOUNTER — Encounter: Payer: Self-pay | Admitting: Nurse Practitioner

## 2023-07-08 VITALS — Ht 67.0 in | Wt 209.6 lb

## 2023-07-08 DIAGNOSIS — R61 Generalized hyperhidrosis: Secondary | ICD-10-CM

## 2023-07-08 DIAGNOSIS — F4323 Adjustment disorder with mixed anxiety and depressed mood: Secondary | ICD-10-CM

## 2023-07-08 MED ORDER — SERTRALINE HCL 25 MG PO TABS
25.0000 mg | ORAL_TABLET | Freq: Every day | ORAL | 0 refills | Status: DC
Start: 2023-07-08 — End: 2023-08-03

## 2023-07-08 NOTE — Progress Notes (Signed)
MyChart Video Visit    Virtual Visit via Video Note   This visit type was conducted because this format is felt to be most appropriate for this patient at this time. Physical exam was limited by quality of the video and audio technology used for the visit. CMA was able to get the patient set up on a video visit.  Patient location: Home. Patient and provider in visit Provider location: Office  I discussed the limitations of evaluation and management by telemedicine and the availability of in person appointments. The patient expressed understanding and agreed to proceed.  Visit Date: 07/08/2023  Today's healthcare provider: Bethanie Dicker, NP     Subjective:    Patient ID: Amy Harvey, female    DOB: 07/04/1991, 32 y.o.   MRN: [id number]  Chief Complaint  Patient presents with   Medical Management of Chronic Issues    2 week follow up ojn zoloft pt now taking 50 mgs     HPI  Patient recently decreased her Zoloft from 100 mg to 50 mg daily over the last 2 weeks due to hyperhidrosis. She has noticed an improvement in her sweating since decreasing. She is interested in continuing to decrease her Zoloft to come off of it completely. Her mood remains stable, denies any problems with depression. She continues to have some anxiety and is still taking her Buspar 10 mg BID.   Past Medical History:  Diagnosis Date   Allergy    grass, dust mites, white oak and hickory   Anxiety    Anxiety    Depression    Dysplastic nevus 04/24/2008   R mid med scapula - mild    Dysplastic nevus 04/24/2008   R upper back 2.5 cm lat to spine - mild    Dysplastic nevus 03/10/2017   L post shoulder axillary area - mild    Dysplastic nevus 03/13/2018   R upper back 3.5 cm lat to spine - severe, excision 07/11/2018   Dysplastic nevus 03/21/2019   R back 3.5 cm lat to spine - mod    Dysplastic nevus 03/16/2021   left ear sup helix - sup margins fee but close - recheck on follow up 09/2021    Vaginal Pap smear, abnormal     Past Surgical History:  Procedure Laterality Date   CESAREAN SECTION N/A 07/24/2021   Procedure: CESAREAN SECTION;  Surgeon: Ranae Pila, MD;  Location: [facility] LD ORS;  Service: Obstetrics;  Laterality: N/A;   TONSILLECTOMY AND ADENOIDECTOMY     as child   WISDOM TOOTH EXTRACTION      Family History  Problem Relation Age of Onset   Breast cancer Mother 64       not genetic   Coronary artery disease Father    Diabetes Father    Hypertension Father    Cirrhosis Father    Heart attack Father 85   Alzheimer's disease Maternal Grandmother    Parkinson's disease Maternal Grandfather    Stroke Paternal Grandmother    Vision loss Paternal Grandmother    Coronary artery disease Paternal Grandfather    Diabetes Paternal Grandfather    Cirrhosis Paternal Grandfather     Social History   Socioeconomic History   Marital status: Married    Spouse name: Greggory Stallion   Number of children: 1   Years of education: college   Highest education level: Bachelor's degree (e.g., BA, AB, BS)  Occupational History   Occupation: Dentist: unemployed  Comment: [employer], psycholog  Tobacco Use   Smoking status: Never   Smokeless tobacco: Never  Vaping Use   Vaping status: Never Used  Substance and Sexual Activity   Alcohol use: Yes    Comment: rare   Drug use: No   Sexual activity: Yes    Partners: Male    Comment: planning IUD  Other Topics Concern   Not on file  Social History Narrative   08/04/21   From: the area   Living: with husband, Greggory Stallion (2020 - met 2010) and child   Work: nanny currently      Family: mom is nearby, dad has passed. In-laws just moved to area, Daughter - Victory Dakin (2022)      Enjoys: movies, concerts, special outings with husband      Exercise: walking dogs regularly with work   Diet: healthy diet      Safety   Seat belts: Yes    Guns: Yes  and secure   Safe in relationships: Yes       Social Determinants of  Health   Financial Resource Strain: High Risk (02/10/2023)   Overall Financial Resource Strain (CARDIA)    Difficulty of Paying Living Expenses: Very hard  Food Insecurity: Food Insecurity Present (02/10/2023)   Hunger Vital Sign    Worried About Running Out of Food in the Last Year: Sometimes true    Ran Out of Food in the Last Year: Never true  Transportation Needs: No Transportation Needs (02/10/2023)   PRAPARE - Administrator, Civil Service (Medical): No    Lack of Transportation (Non-Medical): No  Physical Activity: Sufficiently Active (02/10/2023)   Exercise Vital Sign    Days of Exercise per Week: 5 days    Minutes of Exercise per Session: 30 min  Stress: Stress Concern Present (02/10/2023)   [employer] of Occupational Health - Occupational Stress Questionnaire    Feeling of Stress : To some extent  Social Connections: Unknown (02/10/2023)   Social Connection and Isolation Panel [NHANES]    Frequency of Communication with Friends and Family: Once a week    Frequency of Social Gatherings with Friends and Family: Patient declined    Attends Religious Services: Patient declined    Database administrator or Organizations: No    Attends Engineer, structural: Not on file    Marital Status: Married  Catering manager Violence: Not on file    Outpatient Medications Prior to Visit  Medication Sig Dispense Refill   busPIRone (BUSPAR) 10 MG tablet Take 1 tablet (10 mg total) by mouth 2 (two) times daily. 180 tablet 1   levonorgestrel (MIRENA, 52 MG,) 20 MCG/DAY IUD Provided by care center     spironolactone (ALDACTONE) 25 MG tablet Take 1 tablet by mouth daily.     sertraline (ZOLOFT) 100 MG tablet Take 1 tablet (100 mg total) by mouth daily. 90 tablet 1   No facility-administered medications prior to visit.    No Known Allergies  ROS See HPI    Objective:    Physical Exam  Ht 5\' 7"  (1.702 m)   Wt 209 lb 9.6 oz (95.1 kg)   BMI 32.83 kg/m  Wt  Readings from Last 3 Encounters:  07/08/23 209 lb 9.6 oz (95.1 kg)  04/22/23 209 lb 9.6 oz (95.1 kg)  02/10/23 198 lb 6.4 oz (90 kg)   GENERAL: alert, oriented, appears well and in no acute distress   HEENT: atraumatic, conjunttiva clear, no obvious abnormalities  on inspection of external nose and ears   NECK: normal movements of the head and neck   LUNGS: on inspection no signs of respiratory distress, breathing rate appears normal, no obvious gross SOB, gasping or wheezing   CV: no obvious cyanosis   MS: moves all visible extremities without noticeable abnormality   PSYCH/NEURO: pleasant and cooperative, no obvious depression or anxiety, speech and thought processing grossly intact     Assessment & Plan:   Problem List Items Addressed This Visit       Musculoskeletal and Integument   Hyperhidrosis    Improvement with decreasing Zoloft from 100 mg to 50 mg daily. She would like to continue to wean off the medication, will decrease to 25 mg daily.         Other   Adjustment reaction with anxiety and depression - Primary    Weaning off of Zoloft due to hyperhidrosis. Currently on 50 mg daily, will decrease to 25 mg daily x 2 weeks, then every other day x 1 week, then she will stop completely. She will monitor for any side effects and continue to monitor for any changes in mood or worsening depression symptoms. She will continue her Buspar 10 mg BID for anxiety. She will follow up in one month, sooner PRN.      Relevant Medications   sertraline (ZOLOFT) 25 MG tablet    I have discontinued Ireoluwa L. Vollman's sertraline. I am also having her start on sertraline. Additionally, I am having her maintain her Mirena (52 MG), spironolactone, and busPIRone.  Meds ordered this encounter  Medications   sertraline (ZOLOFT) 25 MG tablet    Sig: Take 1 tablet (25 mg total) by mouth daily.    Dispense:  30 tablet    Refill:  0    Order Specific Question:   Supervising Provider     Answer:   Birdie Sons, ERIC G [[id number]]    I discussed the assessment and treatment plan with the patient. The patient was provided an opportunity to ask questions and all were answered. The patient agreed with the plan and demonstrated an understanding of the instructions.   The patient was advised to call back or seek an in-person evaluation if the symptoms worsen or if the condition fails to improve as anticipated.   Bethanie Dicker, NP [facility] at [facility] [phone] (phone) [phone] (fax)  [facility]

## 2023-07-08 NOTE — Assessment & Plan Note (Signed)
Improvement with decreasing Zoloft from 100 mg to 50 mg daily. She would like to continue to wean off the medication, will decrease to 25 mg daily.

## 2023-07-08 NOTE — Assessment & Plan Note (Addendum)
Weaning off of Zoloft due to hyperhidrosis. Currently on 50 mg daily, will decrease to 25 mg daily x 2 weeks, then every other day x 1 week, then she will stop completely. She will monitor for any side effects and continue to monitor for any changes in mood or worsening depression symptoms. She will continue her Buspar 10 mg BID for anxiety. She will follow up in one month, sooner PRN.

## 2023-08-03 ENCOUNTER — Other Ambulatory Visit: Payer: Self-pay | Admitting: Nurse Practitioner

## 2023-08-03 DIAGNOSIS — F4323 Adjustment disorder with mixed anxiety and depressed mood: Secondary | ICD-10-CM

## 2023-08-15 NOTE — Progress Notes (Deleted)
  Bethanie Dicker, NP-C Phone: [phone]  Amy Harvey is a 32 y.o. female who presents today for ***  ***  Social History   Tobacco Use  Smoking Status Never  Smokeless Tobacco Never    Current Outpatient Medications on File Prior to Visit  Medication Sig Dispense Refill   busPIRone (BUSPAR) 10 MG tablet Take 1 tablet (10 mg total) by mouth 2 (two) times daily. 180 tablet 1   levonorgestrel (MIRENA, 52 MG,) 20 MCG/DAY IUD Provided by care center     sertraline (ZOLOFT) 25 MG tablet TAKE 1 TABLET (25 MG TOTAL) BY MOUTH DAILY. 30 tablet 0   spironolactone (ALDACTONE) 25 MG tablet Take 1 tablet by mouth daily.     No current facility-administered medications on file prior to visit.     ROS see history of present illness  Objective  Physical Exam There were no vitals filed for this visit.  BP Readings from Last 3 Encounters:  04/22/23 110/80  03/23/23 116/70  02/10/23 108/70   Wt Readings from Last 3 Encounters:  07/08/23 209 lb 9.6 oz (95.1 kg)  04/22/23 209 lb 9.6 oz (95.1 kg)  02/10/23 198 lb 6.4 oz (90 kg)    Physical Exam   Assessment/Plan: Please see individual problem list.  There are no diagnoses linked to this encounter.   Health Maintenance: ***  No follow-ups on file.   Bethanie Dicker, NP-C [facility] - [employer]

## 2023-08-16 ENCOUNTER — Ambulatory Visit: Payer: 59 | Admitting: Nurse Practitioner

## 2023-08-16 DIAGNOSIS — F4323 Adjustment disorder with mixed anxiety and depressed mood: Secondary | ICD-10-CM

## 2023-09-26 ENCOUNTER — Telehealth: Payer: Self-pay

## 2023-09-26 NOTE — Telephone Encounter (Signed)
Patient states that psoriasis is flaring, and you have sent her in a topical prescription in the past for it (about 5 years ago), but pt doesn't know the name of the prescription.  I contacted [facility] where patient thinks she had prescription filled, but the pharmacist states that they don't have her in their system. OV notes in Epic don't list psoriasis list as a problem. Please advise.

## 2024-01-31 ENCOUNTER — Ambulatory Visit: Payer: Self-pay | Admitting: Dermatology

## 2024-03-01 ENCOUNTER — Ambulatory Visit: Payer: Self-pay | Admitting: Dermatology

## 2024-03-01 ENCOUNTER — Encounter: Payer: Self-pay | Admitting: Dermatology

## 2024-03-01 DIAGNOSIS — D492 Neoplasm of unspecified behavior of bone, soft tissue, and skin: Secondary | ICD-10-CM

## 2024-03-01 DIAGNOSIS — L84 Corns and callosities: Secondary | ICD-10-CM

## 2024-03-01 DIAGNOSIS — D485 Neoplasm of uncertain behavior of skin: Secondary | ICD-10-CM

## 2024-03-01 NOTE — Progress Notes (Signed)
   Follow-Up Visit   Subjective  Amy Harvey is a 33 y.o. female who presents for the following: possible warts ~80m R lat foot and L plantar foot, has tried wart bandage on R lat foot The patient has spots, moles and lesions to be evaluated, some may be new or changing and the patient may have concern these could be cancer.   The following portions of the chart were reviewed this encounter and updated as appropriate: medications, allergies, medical history  Review of Systems:  No other skin or systemic complaints except as noted in HPI or Assessment and Plan.  Objective  Well appearing patient in no apparent distress; mood and affect are within normal limits.   A focused examination was performed of the following areas: I, Sonya Hupman, RMA, am acting as scribe for Harris Liming, MD .   Relevant exam findings are noted in the Assessment and Plan.    Assessment & Plan   CORN R lat foot Exam: hyperkeratotic papule overlying pressure point on R lat foot  Treatment Plan: Discussed otc corn pads, discussed pairing Recommend Dr Christiana Cower corn callus OTC or other salicylic acid preparations Pairing performed today. Prep with alcohol then pared with 15 blade  Lesion of concern Exam: pinpoint hyperkeratotic papule on left plantar mid foot without associated pressure point  Plan: Monitor for changes growth pain bleeding. Return if changing May be early plantar verruca CORNS AND CALLOSITIES   NEOPLASM OF UNCERTAIN BEHAVIOR OF SKIN    Return for as scheduled for TBSE.  I, Rollie Clipper, RMA, am acting as scribe for Harris Liming, MD .   Documentation: I have reviewed the above documentation for accuracy and completeness, and I agree with the above.  Harris Liming, MD

## 2024-03-01 NOTE — Patient Instructions (Signed)

## 2024-03-04 ENCOUNTER — Other Ambulatory Visit: Payer: Self-pay | Admitting: Nurse Practitioner

## 2024-03-04 DIAGNOSIS — F4323 Adjustment disorder with mixed anxiety and depressed mood: Secondary | ICD-10-CM

## 2024-03-06 ENCOUNTER — Ambulatory Visit: Payer: Self-pay

## 2024-03-06 NOTE — Telephone Encounter (Signed)
 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 Advised OV needed, no availability with PCP until May, scheduled with a different provider.   Copied from CRM [id number]. Topic: Clinical - Red Word Triage >> Mar 06, 2024  9:04 AM Allyne Areola wrote: Red Word that prompted transfer to Nurse Triage: Severe pain lower back and right leg due to sciatic nerve, Patient states it's difficult to walk, bend over or even get dressed in the mornings, She has been experiencing since Saturday. Reason for Disposition  [1] SEVERE back pain (e.g., excruciating, unable to do any normal activities) AND [2] not improved 2 hours after pain medicine  Answer Assessment - Initial Assessment Questions 1. ONSET: "When did the pain begin?"      Saturday 2. LOCATION: "Where does it hurt?" (upper, mid or lower back)     Lower back 3. SEVERITY: "How bad is the pain?"  (e.g., Scale 1-10; mild, moderate, or severe)   - MILD (1-3): Doesn't interfere with normal activities.    - MODERATE (4-7): Interferes with normal activities or awakens from sleep.    - SEVERE (8-10): Excruciating pain, unable to do any normal activities.      8-10 4. PATTERN: "Is the pain constant?" (e.g., yes, no; constant, intermittent)      Constant  5. RADIATION: "Does the pain shoot into your legs or somewhere else?"     Down the  right leg and up back sometimes 6. CAUSE:  "What do you think is causing the back pain?"      Possibly pull, not sure 7. BACK OVERUSE:  "Any recent lifting of heavy objects, strenuous work or exercise?"     Picked up toddler on Saturday, but feeling a little discomfort during workout at gym 8. MEDICINES: "What have you taken so far for the pain?" (e.g., nothing, acetaminophen , NSAIDS)     Ibuprofen  400 mg every 6-8 hours 9. NEUROLOGIC SYMPTOMS: "Do you have any weakness, numbness, or problems with bowel/bladder control?"     Numbness down right leg 10. OTHER SYMPTOMS: "Do you have any other symptoms?" (e.g., fever, abdomen pain, burning with urination, blood in urine)       No  Protocols used: Back Pain-A-AH

## 2024-03-07 ENCOUNTER — Encounter: Payer: Self-pay | Admitting: Family

## 2024-03-07 ENCOUNTER — Ambulatory Visit: Admitting: Family

## 2024-03-07 VITALS — BP 128/78 | HR 67 | Temp 97.2°F | Ht 67.0 in | Wt 162.0 lb

## 2024-03-07 DIAGNOSIS — F4323 Adjustment disorder with mixed anxiety and depressed mood: Secondary | ICD-10-CM | POA: Diagnosis not present

## 2024-03-07 DIAGNOSIS — M5441 Lumbago with sciatica, right side: Secondary | ICD-10-CM

## 2024-03-07 DIAGNOSIS — M545 Low back pain, unspecified: Secondary | ICD-10-CM | POA: Insufficient documentation

## 2024-03-07 MED ORDER — CYCLOBENZAPRINE HCL 10 MG PO TABS
5.0000 mg | ORAL_TABLET | Freq: Every evening | ORAL | 0 refills | Status: DC | PRN
Start: 2024-03-07 — End: 2024-03-27

## 2024-03-07 MED ORDER — PREDNISONE 10 MG PO TABS
ORAL_TABLET | ORAL | 0 refills | Status: DC
Start: 2024-03-07 — End: 2024-03-22

## 2024-03-07 MED ORDER — BUSPIRONE HCL 10 MG PO TABS: 10.0000 mg | ORAL_TABLET | Freq: Two times a day (BID) | ORAL | 3 refills | Status: DC

## 2024-03-07 MED ORDER — ALPRAZOLAM 0.25 MG PO TABS
0.2500 mg | ORAL_TABLET | Freq: Two times a day (BID) | ORAL | 0 refills | Status: DC | PRN
Start: 2024-03-07 — End: 2024-07-19

## 2024-03-07 NOTE — Patient Instructions (Signed)
 For back pain, please start prednisone  this morning.  As discussed do not take prednisone  after noon each day as this will interfere with sleep. You may take Flexeril  which is a muscle relaxant, preferably at bedtime.  If needed a dose during the day time as well, that is acceptable. I have sent in Xanax  for use very sparingly.  If you find yourself using more than 2 days/week, please let me know.  This is a controlled substance and you must store in a safe place.  No alcohol use with Xanax  or Flexeril .  I have also refilled BuSpar .  Very nice meeting you!

## 2024-03-07 NOTE — Progress Notes (Signed)
 Assessment & Plan:  Acute right-sided low back pain with right-sided sciatica Assessment & Plan: Acute onset.  Radicular symptoms present.  No alarm features at this time.  Start prednisone  taper, Flexeril .  Counseled on side effects of prednisone , Flexeril .  She will continue over-the-counter ibuprofen . Close f/u.   Orders: -     predniSONE ; Take 40 mg by mouth on day 1, then taper 10 mg daily until gone  Dispense: 10 tablet; Refill: 0 -     Cyclobenzaprine  HCl; Take 0.5-1 tablets (5-10 mg total) by mouth at bedtime as needed for muscle spasms.  Dispense: 30 tablet; Refill: 0  Adjustment reaction with anxiety and depression Assessment & Plan: She is no longer on Zoloft .  Overall anxiety is improved on BuSpar  10 mg twice daily.  Rare panic attacks.  Provided Xanax  0.25 mg.Counseled on infrequent use and if she finds herself using more often,she will let me know.   Advised  Xanax  in a safe place and not to take with any alcohol.   Orders: -     busPIRone  HCl; Take 1 tablet (10 mg total) by mouth 2 (two) times daily.  Dispense: 180 tablet; Refill: 3 -     ALPRAZolam ; Take 1 tablet (0.25 mg total) by mouth 2 (two) times daily as needed for anxiety.  Dispense: 30 tablet; Refill: 0     Return precautions given.   Risks, benefits, and alternatives of the medications and treatment plan prescribed today were discussed, and patient expressed understanding.   Education regarding symptom management and diagnosis given to patient on AVS either electronically or printed.  Return in about 3 months (around 06/06/2024).  Bascom Bossier, FNP  Subjective:    Patient ID: Amy Harvey, female    DOB: [birth year], 33 y.o.   MRN: [id number]  CC: Amy Harvey is a 33 y.o. female who presents today for an acute visit.    HPI: Complains of low back pain , x 5 days, some improvement Today she is feeling better.   Patient states pain started after picking up her toddler ;  she felt sudden  pain going down her right leg.She had been doing yoga and then core exercise prior too that day.   Pain radiates posterior right ankle. Describes as Shooting, burning pain.   Pain with bending.   She is taking ibuprofen  400 mg every 6-8 hours without relief  No h/o low back pain, back surgery.         Denies saddle anesthesia, urinary or fecal incontinence, dysuria, hematuria, rash, fever   She has rare panic attacks started after daughter was born. She has been on effexor and zoloft  in the past. She feels anxiety is much better however buspar  is not as effective for panic attacks.   She is working on her overall health She has been on xanax  in the past for panic attacks, particularly when traveling.  She is taking buspar  10mg  BID.   She has been on prednisone  in the past No  history of drug addiction  Occasional alcohol use Allergies: Patient has no known allergies. Current Outpatient Medications on File Prior to Visit  Medication Sig Dispense Refill   levonorgestrel (MIRENA, 52 MG,) 20 MCG/DAY IUD Provided by care center     spironolactone (ALDACTONE) 25 MG tablet Take 1 tablet by mouth daily.     No current facility-administered medications on file prior to visit.    Review of Systems  Constitutional:  Negative for chills and fever.  Respiratory:  Negative for cough.   Cardiovascular:  Negative for chest pain and palpitations.  Gastrointestinal:  Negative for nausea and vomiting.  Psychiatric/Behavioral:  Negative for sleep disturbance and suicidal ideas. The patient is nervous/anxious.       Objective:    BP 128/78   Pulse 67   Temp (!) 97.2 F (36.2 C) (Oral)   Ht 5\' 7"  (1.702 m)   Wt 162 lb (73.5 kg)   LMP  (LMP Unknown)   SpO2 99%   BMI 25.37 kg/m   BP Readings from Last 3 Encounters:  03/07/24 128/78  04/22/23 110/80  03/23/23 116/70   Wt Readings from Last 3 Encounters:  03/07/24 162 lb (73.5 kg)  07/08/23 209 lb 9.6 oz (95.1 kg)  04/22/23  209 lb 9.6 oz (95.1 kg)      03/07/2024    9:25 AM 06/24/2023    4:03 PM 02/10/2023    3:05 PM  Depression screen PHQ 2/9  Decreased Interest 0 0 1  Down, Depressed, Hopeless 0 0 1  PHQ - 2 Score 0 0 2  Altered sleeping 0 0 0  Tired, decreased energy 0 0 1  Change in appetite 0 0 1  Feeling bad or failure about yourself  0 0 1  Trouble concentrating 0 0 0  Moving slowly or fidgety/restless 0 0 1  Suicidal thoughts 0 0 0  PHQ-9 Score 0 0 6  Difficult doing work/chores Not difficult at all Not difficult at all Somewhat difficult    Physical Exam Vitals reviewed.  Constitutional:      Appearance: She is well-developed.  Eyes:     Conjunctiva/sclera: Conjunctivae normal.  Cardiovascular:     Rate and Rhythm: Normal rate and regular rhythm.     Pulses: Normal pulses.     Heart sounds: Normal heart sounds.  Pulmonary:     Effort: Pulmonary effort is normal.     Breath sounds: Normal breath sounds. No wheezing, rhonchi or rales.  Musculoskeletal:     Lumbar back: No swelling, edema, spasms, tenderness or bony tenderness. Normal range of motion. Positive right straight leg raise test. Negative left straight leg raise test.     Comments: Full range of motion with flexion, tension, lateral side bends. No bony tenderness. No rash.   Strength 5/5 BLE.   Skin:    General: Skin is warm and dry.  Neurological:     Mental Status: She is alert.     Sensory: No sensory deficit.     Deep Tendon Reflexes:     Reflex Scores:      Patellar reflexes are 2+ on the right side and 2+ on the left side.    Comments: Sensation and strength intact bilateral lower extremities.  Psychiatric:        Speech: Speech normal.        Behavior: Behavior normal.        Thought Content: Thought content normal.

## 2024-03-07 NOTE — Assessment & Plan Note (Signed)
 She is no longer on Zoloft .  Overall anxiety is improved on BuSpar  10 mg twice daily.  Rare panic attacks.  Provided Xanax  0.25 mg.Counseled on infrequent use and if she finds herself using more often,she will let me know.   Advised  Xanax  in a safe place and not to take with any alcohol.

## 2024-03-07 NOTE — Assessment & Plan Note (Signed)
 Acute onset.  Radicular symptoms present.  No alarm features at this time.  Start prednisone  taper, Flexeril .  Counseled on side effects of prednisone , Flexeril .  She will continue over-the-counter ibuprofen . Close f/u.

## 2024-03-22 ENCOUNTER — Other Ambulatory Visit: Payer: Self-pay | Admitting: Family

## 2024-03-22 ENCOUNTER — Ambulatory Visit: Payer: Self-pay

## 2024-03-22 DIAGNOSIS — M5441 Lumbago with sciatica, right side: Secondary | ICD-10-CM

## 2024-03-22 MED ORDER — PREDNISONE 10 MG PO TABS
ORAL_TABLET | ORAL | 0 refills | Status: DC
Start: 2024-03-22 — End: 2024-03-22

## 2024-03-22 MED ORDER — PREDNISONE 10 MG PO TABS
ORAL_TABLET | ORAL | 0 refills | Status: DC
Start: 2024-03-22 — End: 2024-04-02

## 2024-03-22 NOTE — Telephone Encounter (Signed)
 Agree with Amy Harvey   Agree with patient to go to emergency room if pain were to worsen if she was to experience numbness in the groin area, weakness in the legs  I have sent in prednisone  ; she cannot start until in the morning as will interfere with sleep

## 2024-03-22 NOTE — Telephone Encounter (Signed)
 03/27/24 appt with me  noted

## 2024-03-22 NOTE — Telephone Encounter (Signed)
 Message from Colwell H sent at 03/22/2024 11:54 AM EDT  Copied From CRM [id number]. Reason for Triage: sciatic nerve flare up,previously seen on 03/07/2024 and placed on prednisone    Chief Complaint: suspects sciatica flare Symptoms: shooting pain down right leg "debilitating," affecting her gait, stiffness to both legs, brought to her knees with pain at times, lower back pain Frequency: continual Pertinent Negatives: Patient denies weakness Disposition: [] 911 / [] ED /[] Urgent Care (no appt availability in office) / [] Appointment(In office/virtual)/ []  [facility]/ [] Home Care/ [x] Refused Recommended Disposition /[]  Mobile [facility]/ []  Follow-up with PCP Additional Notes: Pt reporting that she recently came in for shooting leg pain on 4/23, was prescribed oral steroids and muscle relaxers, yesterday was feeling much better but thinks she may have overdone it too early, mowed the lawn today and started getting shooting pains again that have been worsening since. Pt reporting she had to take her nighttime flexeril  an hour ago because the pain was so bad. saying how well getting stiffness was dissipating, out mowing the lawn today don't know if flared it up but shooting pains again and getting worse throughout the day, getting bad. Pt requesting earlier follow-up appt or refill of steroids if appropriate. Advised pt be examined in next 4 hours, no PCP availability today, advised UC today, pt requesting appt tomorrow, connected with [facility] per pt request, [facility] unable to offer other appts at this time, nurse informed pt, advised that sending HP message to request call back to pt with further recommendations, advised call back if worsening. Pt verbalized understanding. Placed 5/13 appt on waitlist. Please advise.  Reason for Disposition  [1] SEVERE pain (e.g., excruciating, unable to do any normal activities) AND [2] not improved after 2 hours of pain medicine  Answer Assessment - Initial Assessment  Questions 1. ONSET: "When did the pain start?"      Pain worsening today 2. LOCATION: "Where is the pain located?"      Going down the back of right leg, but stiffness on both legs, right more affected with stabbing pains 3. PAIN: "How bad is the pain?"    (Scale 1-10; or mild, moderate, severe)   -  MILD (1-3): doesn't interfere with normal activities    -  MODERATE (4-7): interferes with normal activities (e.g., work or school) or awakens from sleep, limping    -  SEVERE (8-10): excruciating pain, unable to do any normal activities, unable to walk     Laying flat on back in bed but with shooting pain it's debilitating, unable to walk or do normal activities without pains, look like steve urkle when I'm walking 4. WORK OR EXERCISE: "Has there been any recent work or exercise that involved this part of the body?"      Yes No hx sciatic nerve but recently came in for injury, think from gym or exercise class, was picking up toddler and had shooting pain, not able to bend over or hardly walk when first came in, gave oral steroids and muscle relaxers, yesterday was saying how well getting stiffness was dissipating, out mowing the lawn today don't know if flared it up but shooting pains again and getting worse throughout the day, getting bad More steroids or earlier appt 6. OTHER SYMPTOMS: "Do you have any other symptoms?" (e.g., chest pain, back pain, breathing difficulty, swelling, rash, fever, numbness, weakness)     Lower back, weakness with the pain, kind of go out from under me or down to her knees 7.  PREGNANCY: "Is there any chance you are pregnant?" "When was your last menstrual period?"     no  Protocols used: Leg Pain-A-AH

## 2024-03-23 ENCOUNTER — Ambulatory Visit: Admitting: Nurse Practitioner

## 2024-03-27 ENCOUNTER — Encounter: Payer: Self-pay | Admitting: Family

## 2024-03-27 ENCOUNTER — Ambulatory Visit: Admitting: Family

## 2024-03-27 VITALS — BP 126/80 | HR 79 | Temp 97.6°F | Ht 67.0 in | Wt 162.0 lb

## 2024-03-27 DIAGNOSIS — Z136 Encounter for screening for cardiovascular disorders: Secondary | ICD-10-CM

## 2024-03-27 DIAGNOSIS — Z13220 Encounter for screening for lipoid disorders: Secondary | ICD-10-CM

## 2024-03-27 DIAGNOSIS — M5441 Lumbago with sciatica, right side: Secondary | ICD-10-CM

## 2024-03-27 DIAGNOSIS — E559 Vitamin D deficiency, unspecified: Secondary | ICD-10-CM | POA: Diagnosis not present

## 2024-03-27 DIAGNOSIS — R162 Hepatomegaly with splenomegaly, not elsewhere classified: Secondary | ICD-10-CM | POA: Diagnosis not present

## 2024-03-27 DIAGNOSIS — F4323 Adjustment disorder with mixed anxiety and depressed mood: Secondary | ICD-10-CM

## 2024-03-27 LAB — CBC WITH DIFFERENTIAL/PLATELET
Basophils Absolute: 0 10*3/uL (ref 0.0–0.1)
Basophils Relative: 0.1 % (ref 0.0–3.0)
Eosinophils Absolute: 0 10*3/uL (ref 0.0–0.7)
Eosinophils Relative: 0.3 % (ref 0.0–5.0)
HCT: 45.6 % (ref 36.0–46.0)
Hemoglobin: 15.2 g/dL — ABNORMAL HIGH (ref 12.0–15.0)
Lymphocytes Relative: 12 % (ref 12.0–46.0)
Lymphs Abs: 1.4 10*3/uL (ref 0.7–4.0)
MCHC: 33.4 g/dL (ref 30.0–36.0)
MCV: 91.9 fl (ref 78.0–100.0)
Monocytes Absolute: 0.4 10*3/uL (ref 0.1–1.0)
Monocytes Relative: 3.8 % (ref 3.0–12.0)
Neutro Abs: 9.7 10*3/uL — ABNORMAL HIGH (ref 1.4–7.7)
Neutrophils Relative %: 83.8 % — ABNORMAL HIGH (ref 43.0–77.0)
Platelets: 192 10*3/uL (ref 150.0–400.0)
RBC: 4.96 Mil/uL (ref 3.87–5.11)
RDW: 13.3 % (ref 11.5–15.5)
WBC: 11.5 10*3/uL — ABNORMAL HIGH (ref 4.0–10.5)

## 2024-03-27 LAB — COMPREHENSIVE METABOLIC PANEL WITH GFR
ALT: 21 U/L (ref 0–35)
AST: 15 U/L (ref 0–37)
Albumin: 4.7 g/dL (ref 3.5–5.2)
Alkaline Phosphatase: 53 U/L (ref 39–117)
BUN: 14 mg/dL (ref 6–23)
CO2: 28 meq/L (ref 19–32)
Calcium: 9.7 mg/dL (ref 8.4–10.5)
Chloride: 102 meq/L (ref 96–112)
Creatinine, Ser: 0.93 mg/dL (ref 0.40–1.20)
GFR: 81.18 mL/min (ref >60.00)
Glucose, Bld: 58 mg/dL — ABNORMAL LOW (ref 70–99)
Potassium: 3.8 meq/L (ref 3.5–5.1)
Sodium: 139 meq/L (ref 135–145)
Total Bilirubin: 0.6 mg/dL (ref 0.2–1.2)
Total Protein: 7.3 g/dL (ref 6.0–8.3)

## 2024-03-27 LAB — LIPID PANEL
Cholesterol: 193 mg/dL (ref 0–200)
HDL: 58.5 mg/dL (ref >39.00)
LDL Cholesterol: 117 mg/dL — ABNORMAL HIGH (ref 0–99)
NonHDL: 134.29
Total CHOL/HDL Ratio: 3
Triglycerides: 84 mg/dL (ref 0.0–149.0)
VLDL: 16.8 mg/dL (ref 0.0–40.0)

## 2024-03-27 LAB — B12 AND FOLATE PANEL
Folate: 5.3 ng/mL — ABNORMAL LOW (ref >5.9)
Vitamin B-12: 231 pg/mL (ref 211–911)

## 2024-03-27 LAB — HEMOGLOBIN A1C: Hgb A1c MFr Bld: 5.1 % (ref 4.6–6.5)

## 2024-03-27 LAB — TSH: TSH: 2.32 u[IU]/mL (ref 0.35–5.50)

## 2024-03-27 LAB — VITAMIN D 25 HYDROXY (VIT D DEFICIENCY, FRACTURES): VITD: 57.64 ng/mL (ref 30.00–100.00)

## 2024-03-27 MED ORDER — CYCLOBENZAPRINE HCL 10 MG PO TABS
5.0000 mg | ORAL_TABLET | Freq: Every evening | ORAL | 0 refills | Status: DC | PRN
Start: 2024-03-27 — End: 2024-10-02

## 2024-03-27 NOTE — Progress Notes (Signed)
 Assessment & Plan:  Acute right-sided low back pain with right-sided sciatica Assessment & Plan: Acute, improving. No alarm features at this time. Pending PT, labs. Refilled flexeril  for prn use. Close follow up.   Orders: -     TSH -     Cyclobenzaprine  HCl; Take 0.5-1 tablets (5-10 mg total) by mouth at bedtime as needed for muscle spasms.  Dispense: 30 tablet; Refill: 0 -     B12 and Folate Panel -     Ambulatory referral to Physical Therapy  Hepatosplenomegaly Assessment & Plan: Pending US  RUQ to evaluate mild hepatosplenomegaly seen on CT a/p 05/2022.  Discussed family h/o cirrhosis.   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 Continue buspar  10mg  BID , xanax  0.25mg  every day prn ( rare use)   Encounter for lipid screening for cardiovascular disease -     Hemoglobin A1c -     Comprehensive metabolic panel with GFR -     Lipid panel     Return precautions given.   Risks, benefits, and alternatives of the medications and treatment plan prescribed today were discussed, and patient expressed understanding.   Education regarding symptom management and diagnosis given to patient on AVS either electronically or printed.  Return in about 6 months (around 09/27/2024).  Bascom Bossier, FNP  Subjective:    Patient ID: Amy Harvey, female    DOB: [birth year], 33 y.o.   MRN: [id number]  CC: Amy Harvey is a 33 y.o. female who presents today for follow up.   HPI: TOC   Follow up right low back pain.   She had left low back 6 days ago, improved.  Left leg pain and numbness to knee.   Right low back pain has resolved with prednisone .   Denies fall, known injury.   Denies groin pain, lower extremity weakness,  urinary or fecal incontinence.   Compliant with flexeril  10mg  at bedtime.    She did a yoga class and mowed the grass which is not unusual for her.    Provided prednisone  taper 03/22/24  Compliant with xanax  0.25mg  prn, buspar  10mg  BID She is no longer on zoloft .  Family h/o on father's side of cirrhosis, non alcoholic.   CT a/p 05/2022 d/t abdominal pain with mild hepatosplenomegaly   Allergies: Patient has no known allergies. Current Outpatient Medications on File Prior to Visit  Medication Sig Dispense Refill   Cholecalciferol (VITAMIN D3) 125 MCG (5000 UT) TABS Take 1 tablet by mouth daily.     ALPRAZolam  (XANAX ) 0.25 MG tablet Take 1 tablet (0.25 mg total) by mouth 2 (two) times daily as needed for anxiety. 30 tablet 0   busPIRone  (BUSPAR ) 10 MG tablet Take 1 tablet (10 mg total) by mouth 2 (two) times daily. 180 tablet 3   levonorgestrel (MIRENA, 52 MG,) 20 MCG/DAY IUD Provided by care center     spironolactone (ALDACTONE) 25 MG tablet Take 1 tablet by mouth daily.     No current facility-administered medications on file prior to visit.    Review of Systems  Constitutional:  Negative for chills and fever.  Respiratory:  Negative for cough.   Cardiovascular:  Negative for chest pain and palpitations.  Gastrointestinal:  Negative for nausea and vomiting.  Genitourinary:  Negative for difficulty urinating.  Musculoskeletal:  Positive for back pain.  Neurological:  Positive for numbness.  Psychiatric/Behavioral:  The patient is not nervous/anxious.       Objective:    BP 126/80   Pulse 79   Temp 97.6 F (36.4 C) (Oral)   Ht 5\' 7"  (1.702 m)   Wt 162 lb (73.5 kg)   LMP  (LMP Unknown)   SpO2 98%   BMI 25.37 kg/m  BP Readings from Last 3 Encounters:  04/02/24 120/76  03/27/24 126/80  03/07/24 128/78   Wt Readings from Last 3 Encounters:  04/02/24 156 lb 6 oz (70.9 kg)  03/27/24 162 lb (73.5 kg)  03/07/24 162 lb (73.5 kg)      03/27/2024    9:29 AM 03/07/2024     9:25 AM 06/24/2023    4:03 PM  Depression screen PHQ 2/9  Decreased Interest 0 0 0  Down, Depressed, Hopeless 0 0 0  PHQ - 2 Score 0 0 0  Altered sleeping 0 0 0  Tired, decreased energy 0 0 0  Change in appetite 0 0 0  Feeling bad or failure about yourself  0 0 0  Trouble concentrating 0 0 0  Moving slowly or fidgety/restless 0 0 0  Suicidal thoughts 0 0 0  PHQ-9 Score 0 0 0  Difficult doing work/chores Not difficult at all Not difficult at all Not difficult at all     Physical Exam Vitals reviewed.  Constitutional:      Appearance: She is well-developed.  Eyes:     Conjunctiva/sclera: Conjunctivae normal.  Cardiovascular:     Rate and Rhythm: Normal rate and regular rhythm.     Pulses: Normal pulses.     Heart sounds: Normal heart sounds.  Pulmonary:     Effort: Pulmonary effort is normal.     Breath sounds: Normal breath sounds. No wheezing, rhonchi or rales.  Musculoskeletal:     Lumbar back: No swelling, edema, spasms, tenderness or bony tenderness. Normal range of motion.     Comments: No pain of left low back elicited on exam.Full range of motion with flexion, tension, lateral side bends. No bony tenderness. No pain, numbness, tingling elicited with single leg raise bilaterally.   Skin:    General: Skin is warm and dry.  Neurological:     Mental Status: She is alert.     Sensory: No sensory deficit.     Deep Tendon Reflexes:     Reflex Scores:      Patellar reflexes are 2+ on the right side and 2+ on the left side.    Comments: Sensation and strength intact bilateral lower extremities.  Psychiatric:        Speech: Speech normal.        Behavior: Behavior normal.        Thought Content: Thought content normal.

## 2024-03-29 ENCOUNTER — Ambulatory Visit: Payer: 59 | Admitting: Dermatology

## 2024-03-29 ENCOUNTER — Encounter: Payer: Self-pay | Admitting: Dermatology

## 2024-03-29 DIAGNOSIS — Z808 Family history of malignant neoplasm of other organs or systems: Secondary | ICD-10-CM

## 2024-03-29 DIAGNOSIS — W908XXA Exposure to other nonionizing radiation, initial encounter: Secondary | ICD-10-CM | POA: Diagnosis not present

## 2024-03-29 DIAGNOSIS — L814 Other melanin hyperpigmentation: Secondary | ICD-10-CM

## 2024-03-29 DIAGNOSIS — Z1283 Encounter for screening for malignant neoplasm of skin: Secondary | ICD-10-CM | POA: Diagnosis not present

## 2024-03-29 DIAGNOSIS — D2221 Melanocytic nevi of right ear and external auricular canal: Secondary | ICD-10-CM

## 2024-03-29 DIAGNOSIS — L821 Other seborrheic keratosis: Secondary | ICD-10-CM

## 2024-03-29 DIAGNOSIS — D2262 Melanocytic nevi of left upper limb, including shoulder: Secondary | ICD-10-CM | POA: Diagnosis not present

## 2024-03-29 DIAGNOSIS — L578 Other skin changes due to chronic exposure to nonionizing radiation: Secondary | ICD-10-CM | POA: Diagnosis not present

## 2024-03-29 DIAGNOSIS — D225 Melanocytic nevi of trunk: Secondary | ICD-10-CM | POA: Diagnosis not present

## 2024-03-29 DIAGNOSIS — L2389 Allergic contact dermatitis due to other agents: Secondary | ICD-10-CM

## 2024-03-29 DIAGNOSIS — Z79899 Other long term (current) drug therapy: Secondary | ICD-10-CM

## 2024-03-29 DIAGNOSIS — Z7189 Other specified counseling: Secondary | ICD-10-CM

## 2024-03-29 DIAGNOSIS — D229 Melanocytic nevi, unspecified: Secondary | ICD-10-CM

## 2024-03-29 DIAGNOSIS — D492 Neoplasm of unspecified behavior of bone, soft tissue, and skin: Secondary | ICD-10-CM | POA: Diagnosis not present

## 2024-03-29 DIAGNOSIS — Z86018 Personal history of other benign neoplasm: Secondary | ICD-10-CM

## 2024-03-29 MED ORDER — MOMETASONE FUROATE 0.1 % EX CREA: 1.0000 | TOPICAL_CREAM | CUTANEOUS | 0 refills | Status: DC

## 2024-03-29 NOTE — Progress Notes (Signed)
 Follow-Up Visit   Subjective  Amy Harvey is a 33 y.o. female who presents for the following: Skin Cancer Screening and Full Body Skin Exam hx of Dysplastic Nevi  The patient presents for Total-Body Skin Exam (TBSE) for skin cancer screening and mole check. The patient has spots, moles and lesions to be evaluated, some may be new or changing and the patient may have concern these could be cancer.  The following portions of the chart were reviewed this encounter and updated as appropriate: medications, allergies, medical history  Review of Systems:  No other skin or systemic complaints except as noted in HPI or Assessment and Plan.  Objective  Well appearing patient in no apparent distress; mood and affect are within normal limits.  A full examination was performed including scalp, head, eyes, ears, nose, lips, neck, chest, axillae, abdomen, back, buttocks, bilateral upper extremities, bilateral lower extremities, hands, feet, fingers, toes, fingernails, and toenails. All findings within normal limits unless otherwise noted below.   Relevant physical exam findings are noted in the Assessment and Plan.  L sup buttocks below sup waistline Irregular brown macule 0.6cm  L medial bicep 0.3cm irregular brown macule  R proximal medial bicep 0.2cm irregular brown macule  L lat epigastric 0.2cm irregular brown macule  R sup ear    Assessment & Plan   SKIN CANCER SCREENING PERFORMED TODAY.  ACTINIC DAMAGE - Chronic condition, secondary to cumulative UV/sun exposure - diffuse scaly erythematous macules with underlying dyspigmentation - Recommend daily broad spectrum sunscreen SPF 30+ to sun-exposed areas, reapply every 2 hours as needed.  - Staying in the shade or wearing long sleeves, sun glasses (UVA+UVB protection) and wide brim hats (4-inch brim around the entire circumference of the hat) are also recommended for sun protection.  - Call for new or changing  lesions.  LENTIGINES, SEBORRHEIC KERATOSES, HEMANGIOMAS - Benign normal skin lesions - Benign-appearing - Call for any changes  MELANOCYTIC NEVI - Tan-brown and/or pink-flesh-colored symmetric macules and papules - Benign appearing on exam today - Observation - Call clinic for new or changing moles - Recommend daily use of broad spectrum spf 30+ sunscreen to sun-exposed areas.  - R sup ear 0.2cm, brown macule  HISTORY OF DYSPLASTIC NEVUS No evidence of recurrence today Recommend regular full body skin exams Recommend daily broad spectrum sunscreen SPF 30+ to sun-exposed areas, reapply every 2 hours as needed.  Call if any new or changing lesions are noted between office visits  - R mid med scapul, R upper back 2.5cm lat to spine, L post shoulder. R upper back 3.5cm lat to spine, R back 3.5cm lat to spine, L ear sup helix  ALLERGIC CONTACT DERMATITIS Secondary to waterproof bandage L chest Exam: scaly pink papules and/or plaques +/- vesiculation Treatment Plan: Start Mometasone  cr bid aa chest until resolved  Topical steroids (such as triamcinolone, fluocinolone, fluocinonide, mometasone , clobetasol, halobetasol, betamethasone , hydrocortisone) can cause thinning and lightening of the skin if they are used for too long in the same area. Your physician has selected the right strength medicine for your problem and area affected on the body. Please use your medication only as directed by your physician to prevent side effects.   FAMILY HISTORY OF SKIN CANCER What type(s):Melanoma Who affected:Father    NEOPLASM OF SKIN (4) L sup buttocks below sup waistline Epidermal / dermal shaving  Lesion diameter (cm):  0.6 Informed consent: discussed and consent obtained   Timeout: patient name, date of birth, surgical site, and  procedure verified   Procedure prep:  Patient was prepped and draped in usual sterile fashion Prep type:  Isopropyl alcohol Anesthesia: the lesion was anesthetized  in a standard fashion   Anesthetic:  1% lidocaine w/ epinephrine 1-100,000 buffered w/ 8.4% NaHCO3 Instrument used: DermaBlade   Hemostasis achieved with: pressure, aluminum chloride and electrodesiccation   Outcome: patient tolerated procedure well   Post-procedure details: sterile dressing applied and wound care instructions given   Dressing type: bandage and bacitracin   Specimen 1 - Surgical pathology Differential Diagnosis: Nevus vs Dysplastic Nevus  Check Margins: yes Irregular brown macule 0.6cm L medial bicep Epidermal / dermal shaving  Lesion diameter (cm):  0.3 Informed consent: discussed and consent obtained   Timeout: patient name, date of birth, surgical site, and procedure verified   Procedure prep:  Patient was prepped and draped in usual sterile fashion Prep type:  Isopropyl alcohol Anesthesia: the lesion was anesthetized in a standard fashion   Anesthetic:  1% lidocaine w/ epinephrine 1-100,000 buffered w/ 8.4% NaHCO3 Instrument used: DermaBlade   Hemostasis achieved with: pressure, aluminum chloride and electrodesiccation   Outcome: patient tolerated procedure well   Post-procedure details: sterile dressing applied and wound care instructions given   Dressing type: bandage and bacitracin   Specimen 2 - Surgical pathology Differential Diagnosis: Nevus vs Dysplastic Nevus  Check Margins: yes 0.3cm irregular brown macule R proximal medial bicep Epidermal / dermal shaving  Lesion diameter (cm):  0.2 Informed consent: discussed and consent obtained   Timeout: patient name, date of birth, surgical site, and procedure verified   Procedure prep:  Patient was prepped and draped in usual sterile fashion Prep type:  Isopropyl alcohol Anesthesia: the lesion was anesthetized in a standard fashion   Anesthetic:  1% lidocaine w/ epinephrine 1-100,000 buffered w/ 8.4% NaHCO3 Instrument used: DermaBlade   Hemostasis achieved with: pressure, aluminum chloride and  electrodesiccation   Outcome: patient tolerated procedure well   Post-procedure details: sterile dressing applied and wound care instructions given   Dressing type: bandage and bacitracin   Specimen 3 - Surgical pathology Differential Diagnosis: Nevus vs Dysplastic Nevus  Check Margins: yes 0.2cm irregular brown macule L lat epigastric Epidermal / dermal shaving  Lesion diameter (cm):  0.2 Informed consent: discussed and consent obtained   Timeout: patient name, date of birth, surgical site, and procedure verified   Procedure prep:  Patient was prepped and draped in usual sterile fashion Prep type:  Isopropyl alcohol Anesthesia: the lesion was anesthetized in a standard fashion   Anesthetic:  1% lidocaine w/ epinephrine 1-100,000 buffered w/ 8.4% NaHCO3 Instrument used: DermaBlade   Hemostasis achieved with: pressure, aluminum chloride and electrodesiccation   Outcome: patient tolerated procedure well   Post-procedure details: sterile dressing applied and wound care instructions given   Dressing type: bandage and bacitracin   Specimen 4 - Surgical pathology Differential Diagnosis: Nevus vs Dysplastic Nevus  Check Margins: yes 0.2cm irregular brown macule SKIN CANCER SCREENING   HISTORY OF DYSPLASTIC NEVUS   ACTINIC SKIN DAMAGE   LENTIGO   MELANOCYTIC NEVUS, UNSPECIFIED LOCATION   FAMILY HISTORY OF SKIN CANCER   ALLERGIC CONTACT DERMATITIS DUE TO OTHER AGENTS   COUNSELING AND COORDINATION OF CARE   MEDICATION MANAGEMENT   Return in about 1 year (around 03/29/2025) for TBSE, Hx of Dysplastic nevi.  I, Rollie Clipper, RMA, am acting as scribe for Celine Collard, MD .   Documentation: I have reviewed the above documentation for accuracy and completeness, and I agree  with the above.  Celine Collard, MD

## 2024-03-29 NOTE — Patient Instructions (Signed)

## 2024-04-02 ENCOUNTER — Ambulatory Visit: Payer: Self-pay | Admitting: Family

## 2024-04-02 ENCOUNTER — Ambulatory Visit: Admitting: Family Medicine

## 2024-04-02 ENCOUNTER — Encounter: Payer: Self-pay | Admitting: Family Medicine

## 2024-04-02 ENCOUNTER — Ambulatory Visit (INDEPENDENT_AMBULATORY_CARE_PROVIDER_SITE_OTHER): Admitting: Family Medicine

## 2024-04-02 ENCOUNTER — Encounter: Payer: Self-pay | Admitting: Dermatology

## 2024-04-02 VITALS — BP 120/76 | HR 86 | Temp 98.6°F | Resp 20 | Ht 67.0 in | Wt 156.4 lb

## 2024-04-02 DIAGNOSIS — J069 Acute upper respiratory infection, unspecified: Secondary | ICD-10-CM

## 2024-04-02 DIAGNOSIS — J0110 Acute frontal sinusitis, unspecified: Secondary | ICD-10-CM | POA: Diagnosis not present

## 2024-04-02 DIAGNOSIS — O365990 Maternal care for other known or suspected poor fetal growth, unspecified trimester, not applicable or unspecified: Secondary | ICD-10-CM | POA: Insufficient documentation

## 2024-04-02 DIAGNOSIS — Z8489 Family history of other specified conditions: Secondary | ICD-10-CM | POA: Insufficient documentation

## 2024-04-02 DIAGNOSIS — H53129 Transient visual loss, unspecified eye: Secondary | ICD-10-CM | POA: Insufficient documentation

## 2024-04-02 DIAGNOSIS — J329 Chronic sinusitis, unspecified: Secondary | ICD-10-CM | POA: Insufficient documentation

## 2024-04-02 DIAGNOSIS — Z6791 Unspecified blood type, Rh negative: Secondary | ICD-10-CM | POA: Insufficient documentation

## 2024-04-02 DIAGNOSIS — R162 Hepatomegaly with splenomegaly, not elsewhere classified: Secondary | ICD-10-CM

## 2024-04-02 DIAGNOSIS — O43199 Other malformation of placenta, unspecified trimester: Secondary | ICD-10-CM | POA: Insufficient documentation

## 2024-04-02 DIAGNOSIS — R899 Unspecified abnormal finding in specimens from other organs, systems and tissues: Secondary | ICD-10-CM

## 2024-04-02 DIAGNOSIS — R772 Abnormality of alphafetoprotein: Secondary | ICD-10-CM | POA: Insufficient documentation

## 2024-04-02 MED ORDER — AMOXICILLIN-POT CLAVULANATE 875-125 MG PO TABS
1.0000 | ORAL_TABLET | Freq: Two times a day (BID) | ORAL | 0 refills | Status: DC
Start: 2024-04-02 — End: 2024-10-02

## 2024-04-02 MED ORDER — BENZONATATE 100 MG PO CAPS
100.0000 mg | ORAL_CAPSULE | Freq: Three times a day (TID) | ORAL | 0 refills | Status: DC | PRN
Start: 2024-04-02 — End: 2024-10-02

## 2024-04-02 MED ORDER — LEVOCETIRIZINE DIHYDROCHLORIDE 5 MG PO TABS
5.0000 mg | ORAL_TABLET | Freq: Every evening | ORAL | 0 refills | Status: DC
Start: 2024-04-02 — End: 2024-10-02

## 2024-04-02 MED ORDER — SACCHAROMYCES BOULARDII 250 MG PO CAPS
250.0000 mg | ORAL_CAPSULE | Freq: Every day | ORAL | 0 refills | Status: DC
Start: 2024-04-02 — End: 2024-10-02

## 2024-04-02 MED ORDER — AZELASTINE HCL 0.1 % NA SOLN
2.0000 | Freq: Two times a day (BID) | NASAL | 12 refills | Status: DC
Start: 2024-04-02 — End: 2024-10-02

## 2024-04-02 MED ORDER — FLUTICASONE PROPIONATE 50 MCG/ACT NA SUSP
2.0000 | Freq: Every day | NASAL | 6 refills | Status: DC
Start: 2024-04-02 — End: 2024-10-02

## 2024-04-02 NOTE — Assessment & Plan Note (Signed)
 Pending US  RUQ to evaluate mild hepatosplenomegaly seen on CT a/p 05/2022.  Discussed family h/o cirrhosis.

## 2024-04-02 NOTE — Patient Instructions (Addendum)
 It was a pleasure meeting you today. Thank you for allowing me to take part in your health care.  Our goals for today as we discussed include:  Start Flonase  2 sprays two times a day Start Astelin  2 sprays two times a day Start Xyzal  5 mg at night  Tessalon  perles three times a day as needed.  Please ensure these are kept away from small children  Start Augmentin  1 tablet two times a day for 5 days Start Probiotics daily and continue for at least 14 days after completion of treatment  You can take Tylenol  and/or Ibuprofen  as needed for fever reduction and pain relief.   For cough: honey 1/2 to 1 teaspoon (you can dilute the honey in water or another fluid).  You can also use guaifenesin and dextromethorphan for cough. You can use a humidifier for chest congestion and cough.  If you don't have a humidifier, you can sit in the bathroom with the hot shower running.      For sore throat: try warm salt water gargles, cepacol lozenges, throat spray, warm tea or water with lemon/honey, popsicles or ice, or OTC cold relief medicine for throat discomfort.   For congestion: take a daily anti-histamine like Zyrtec, Claritin, and a oral decongestant, such as pseudoephedrine.  You can also use Flonase  1-2 sprays in each nostril daily.   It is important to stay hydrated: drink plenty of fluids (water, gatorade/powerade/pedialyte, juices, or teas) to keep your throat moisturized and help further relieve irritation/discomfort.    This is a list of the screening recommended for you and due dates:  Health Maintenance  Topic Date Due   HIV Screening  Never done   Hepatitis C Screening  Never done   COVID-19 Vaccine (2 - Janssen risk series) 02/24/2020   Pap with HPV screening  09/12/2023   Flu Shot  06/15/2024   DTaP/Tdap/Td vaccine (8 - Td or Tdap) 07/16/2031   HPV Vaccine  Aged Out   Meningitis B Vaccine  Aged Out      If you have any questions or concerns, please do not hesitate to call the  office at [phone].  I look forward to our next visit and until then take care and stay safe.  Regards,   Valli Gaw, MD   [facility]

## 2024-04-02 NOTE — Assessment & Plan Note (Signed)
 Chronic, stable. Continue buspar  10mg  BID , xanax  0.25mg  every day prn ( rare use)

## 2024-04-02 NOTE — Assessment & Plan Note (Signed)
 Acute, improving. No alarm features at this time. Pending PT, labs. Refilled flexeril  for prn use. Close follow up.

## 2024-04-02 NOTE — Progress Notes (Unsigned)
 SUBJECTIVE:   Chief Complaint  Patient presents with   URI    X 4 days   HPI Presents for acute visit  Discussed the use of AI scribe software for clinical note transcription with the patient, who gave verbal consent to proceed.  History of Present Illness Amy Harvey is a 33 year old female who presents with respiratory issues and a persistent cough. She is accompanied by her two and a 21 year old daughter.  She has been experiencing respiratory issues characterized by a persistent cough and production of green mucus from her chest and nose. The symptoms began on Thursday and significantly worsened over the weekend. She has a low-grade fever, with the highest recorded temperature being 99.15F, and continues to feel febrile despite not monitoring her temperature regularly. She has been taking Tylenol  and Excedrin to manage her fever and sinus pressure.  No shortness of breath or wheezing, except when coughing. She reports nasal congestion and a runny nose. She has a history of being prone to sinus infections and has previously used nasal sprays, though not recently. Her husband and others in her environment have been sick, with her husband experiencing symptoms similar to bronchitis. She does not smoke and does not believe she has been exposed to any new allergens, though she acknowledges the possibility of developing a new allergy. She has a 26 year old dog at home.  She reports sinus pressure and headaches, which were severe yesterday but have improved today. However, her chest symptoms have worsened. No brown or red sputum production. She has not had a fever exceeding 101F and has been using Excedrin and fever reducers. No allergies, asthma, or COPD.     PERTINENT PMH / PSH: As above  OBJECTIVE:  BP 120/76   Pulse 86   Temp 98.6 F (37 C)   Resp 20   Ht 5\' 7"  (1.702 m)   Wt 156 lb 6 oz (70.9 kg)   LMP  (LMP Unknown)   SpO2 98%   BMI 24.49 kg/m    Physical  Exam Vitals reviewed.  Constitutional:      General: She is not in acute distress.    Appearance: Normal appearance. She is normal weight. She is not ill-appearing, toxic-appearing or diaphoretic.  HENT:     Right Ear: Tympanic membrane, ear canal and external ear normal.     Left Ear: Tympanic membrane, ear canal and external ear normal.     Nose: Rhinorrhea present. Rhinorrhea is purulent.     Right Turbinates: Swollen and pale.     Left Turbinates: Swollen and pale.     Right Sinus: Frontal sinus tenderness present.     Left Sinus: Frontal sinus tenderness present.  Eyes:     General:        Right eye: No discharge.        Left eye: No discharge.     Conjunctiva/sclera: Conjunctivae normal.  Cardiovascular:     Rate and Rhythm: Normal rate and regular rhythm.     Heart sounds: Normal heart sounds.  Pulmonary:     Effort: Pulmonary effort is normal.     Breath sounds: Normal breath sounds. No wheezing or rhonchi.  Abdominal:     General: Bowel sounds are normal.  Musculoskeletal:        General: Normal range of motion.  Skin:    General: Skin is warm and dry.  Neurological:     General: No focal deficit present.  Mental Status: She is alert and oriented to person, place, and time. Mental status is at baseline.  Psychiatric:        Mood and Affect: Mood normal.        Behavior: Behavior normal.        Thought Content: Thought content normal.        Judgment: Judgment normal.           03/27/2024    9:29 AM 03/07/2024    9:25 AM 06/24/2023    4:03 PM 02/10/2023    3:05 PM 11/09/2022   10:30 AM  Depression screen PHQ 2/9  Decreased Interest 0 0 0 1 1  Down, Depressed, Hopeless 0 0 0 1 1  PHQ - 2 Score 0 0 0 2 2  Altered sleeping 0 0 0 0 0  Tired, decreased energy 0 0 0 1 1  Change in appetite 0 0 0 1 1  Feeling bad or failure about yourself  0 0 0 1 2  Trouble concentrating 0 0 0 0 2  Moving slowly or fidgety/restless 0 0 0 1 3  Suicidal thoughts 0 0 0 0 0   PHQ-9 Score 0 0 0 6 11  Difficult doing work/chores Not difficult at all Not difficult at all Not difficult at all Somewhat difficult Somewhat difficult      03/27/2024    9:30 AM 03/07/2024    9:25 AM 06/24/2023    4:03 PM 02/10/2023    3:05 PM  GAD 7 : Generalized Anxiety Score  Nervous, Anxious, on Edge 0 0 0 1  Control/stop worrying 0 0 0 1  Worry too much - different things 0 0 0 1  Trouble relaxing 0 0 0 2  Restless 0 0 0 0  Easily annoyed or irritable 0 0 0 1  Afraid - awful might happen 0 0 0 1  Total GAD 7 Score 0 0 0 7  Anxiety Difficulty Not difficult at all Not difficult at all Not difficult at all Not difficult at all    ASSESSMENT/PLAN:  Acute frontal sinusitis, recurrence not specified Assessment & Plan: Sinusitis with sinus pressure and headache, improving. Nasal congestion and green mucus present. Fever suggests possible bacterial infection. - Consider nasal sprays for congestion relief. - Prescribed antibiotics due to fever.  Orders: -     Fluticasone  Propionate; Place 2 sprays into both nostrils daily.  Dispense: 16 g; Refill: 6 -     Azelastine  HCl; Place 2 sprays into both nostrils 2 (two) times daily. Use in each nostril as directed  Dispense: 30 mL; Refill: 12 -     Levocetirizine Dihydrochloride ; Take 1 tablet (5 mg total) by mouth every evening.  Dispense: 30 tablet; Refill: 0 -     Amoxicillin -Pot Clavulanate; Take 1 tablet by mouth 2 (two) times daily.  Dispense: 20 tablet; Refill: 0 -     Saccharomyces boulardii; Take 1 capsule (250 mg total) by mouth daily.  Dispense: 90 capsule; Refill: 0  Viral URI with cough Assessment & Plan: Likely viral etiology with cough, nasal congestion, and low-grade fever. No wheezing or dyspnea. Family members with similar symptoms. - Prescribed cough suppressant for symptomatic relief.  Orders: -     Benzonatate ; Take 1 capsule (100 mg total) by mouth 3 (three) times daily as needed for cough.  Dispense: 20 capsule;  Refill: 0     PDMP reviewed  Return if symptoms worsen or fail to improve, for PCP.  Valli Gaw, MD

## 2024-04-03 ENCOUNTER — Ambulatory Visit: Payer: Self-pay | Admitting: Dermatology

## 2024-04-03 ENCOUNTER — Ambulatory Visit
Admission: RE | Admit: 2024-04-03 | Discharge: 2024-04-03 | Disposition: A | Discharge status: Home / Self Care | Source: Ambulatory Visit | Attending: Family | Admitting: Family

## 2024-04-03 DIAGNOSIS — R162 Hepatomegaly with splenomegaly, not elsewhere classified: Secondary | ICD-10-CM | POA: Insufficient documentation

## 2024-04-03 LAB — SURGICAL PATHOLOGY

## 2024-04-04 ENCOUNTER — Encounter: Payer: Self-pay | Admitting: Dermatology

## 2024-04-04 NOTE — Telephone Encounter (Addendum)
 Called and discussed results with patient. She verbalized understanding and denied further questions. Informed patient areas 1,2, and 4 may need additional treatment if change and will recheck all areas at follow up. Scheduled patient for 6 - 8 month follow up to recheck areas.   ----- Message from Celine Collard sent at 04/03/2024  7:25 PM EDT ----- FINAL DIAGNOSIS       1. Skin, L sup buttocks below sup waistline :      JUNCTIONAL DYSPLASTIC MELANOCYTIC NEVUS WITH MODERATE TO SEVERE ATYPIA, CLOSE TO      MARGIN, SEE DESCRIPTION       2. Skin, L medial bicep :      DYSPLASTIC JUNCTIONAL LENTIGINOUS NEVUS WITH MODERATE TO SEVERE ATYPIA, CLOSE TO      MARGIN, SEE DESCRIPTION       3. Skin, R proximal medial bicep :      PIGMENTED SEBORRHEIC KERATOSIS       4. Skin, L lat epigastric :      DYSPLASTIC JUNCTIONAL LENTIGINOUS NEVUS WITH MODERATE TO SEVERE ATYPIA, CLOSE TO      MARGIN, SEE DESCRIPTION    1,2,4 - all three Moderate to severe dysplastic Margins clear, but "close to margin"  May need additional procedure. Recheck 6-8 months.  Make pt APPOINTMENT IN 6-8 MONTHS.  3- Benign keratosis No further treatment needed

## 2024-04-08 ENCOUNTER — Encounter: Payer: Self-pay | Admitting: Family Medicine

## 2024-04-08 DIAGNOSIS — J0110 Acute frontal sinusitis, unspecified: Secondary | ICD-10-CM | POA: Insufficient documentation

## 2024-04-08 DIAGNOSIS — J069 Acute upper respiratory infection, unspecified: Secondary | ICD-10-CM | POA: Insufficient documentation

## 2024-04-08 NOTE — Assessment & Plan Note (Signed)
 Likely viral etiology with cough, nasal congestion, and low-grade fever. No wheezing or dyspnea. Family members with similar symptoms. - Prescribed cough suppressant for symptomatic relief.

## 2024-04-08 NOTE — Assessment & Plan Note (Signed)
 Sinusitis with sinus pressure and headache, improving. Nasal congestion and green mucus present. Fever suggests possible bacterial infection. - Consider nasal sprays for congestion relief. - Prescribed antibiotics due to fever.

## 2024-04-12 NOTE — Telephone Encounter (Signed)
 Copied from CRM [id number]. Topic: General - Call Back - No Documentation >> Apr 12, 2024  9:25 AM Amy Harvey wrote: Reason for CRM: Patient is returning call from Nyaisha Simao regarding lab results. Requesting follow up.

## 2024-04-17 ENCOUNTER — Other Ambulatory Visit

## 2024-04-23 ENCOUNTER — Other Ambulatory Visit (INDEPENDENT_AMBULATORY_CARE_PROVIDER_SITE_OTHER): Payer: Self-pay

## 2024-04-23 DIAGNOSIS — R899 Unspecified abnormal finding in specimens from other organs, systems and tissues: Secondary | ICD-10-CM

## 2024-04-23 NOTE — Addendum Note (Signed)
 Addended by: Gradie Lawless A on: 04/23/2024 10:38 AM   Modules accepted: Orders

## 2024-04-26 LAB — CBC WITH DIFFERENTIAL/PLATELET
Basophils Absolute: 0 10*3/uL (ref 0.0–0.2)
Basos: 0 %
EOS (ABSOLUTE): 0.1 10*3/uL (ref 0.0–0.4)
Eos: 1 %
Hematocrit: 45 % (ref 34.0–46.6)
Hemoglobin: 14.7 g/dL (ref 11.1–15.9)
Immature Grans (Abs): 0 10*3/uL (ref 0.0–0.1)
Immature Granulocytes: 0 %
Lymphocytes Absolute: 1.9 10*3/uL (ref 0.7–3.1)
Lymphs: 24 %
MCH: 30.9 pg (ref 26.6–33.0)
MCHC: 32.7 g/dL (ref 31.5–35.7)
MCV: 95 fL (ref 79–97)
Monocytes Absolute: 0.6 10*3/uL (ref 0.1–0.9)
Monocytes: 7 %
Neutrophils Absolute: 5.3 10*3/uL (ref 1.4–7.0)
Neutrophils: 68 %
Platelets: 225 10*3/uL (ref 150–450)
RBC: 4.76 x10E6/uL (ref 3.77–5.28)
RDW: 12.6 % (ref 11.7–15.4)
WBC: 7.9 10*3/uL (ref 3.4–10.8)

## 2024-04-26 LAB — CELIAC DISEASE AB SCREEN W/RFX
Antigliadin Abs, IgA: 6 U (ref 0–19)
IgA/Immunoglobulin A, Serum: 192 mg/dL (ref 87–352)

## 2024-04-26 LAB — METHYLMALONIC ACID, SERUM: Methylmalonic Acid: 174 nmol/L (ref 0–378)

## 2024-04-26 LAB — HOMOCYSTEINE: Homocysteine: 15.2 umol/L — ABNORMAL HIGH (ref 0.0–14.5)

## 2024-04-26 LAB — INTRINSIC FACTOR ANTIBODIES: Intrinsic Factor Abs, Serum: 1 [AU]/ml (ref 0.0–1.1)

## 2024-04-29 ENCOUNTER — Other Ambulatory Visit: Payer: Self-pay | Admitting: Family Medicine

## 2024-04-29 DIAGNOSIS — J0110 Acute frontal sinusitis, unspecified: Secondary | ICD-10-CM

## 2024-04-30 ENCOUNTER — Ambulatory Visit: Payer: Self-pay | Admitting: Family

## 2024-04-30 DIAGNOSIS — E538 Deficiency of other specified B group vitamins: Secondary | ICD-10-CM | POA: Insufficient documentation

## 2024-04-30 NOTE — Telephone Encounter (Signed)
 Spoke with pt and she stated that she does not need this medication refilled.

## 2024-05-03 ENCOUNTER — Other Ambulatory Visit: Payer: Self-pay | Admitting: Family

## 2024-05-03 DIAGNOSIS — M5441 Lumbago with sciatica, right side: Secondary | ICD-10-CM

## 2024-06-06 ENCOUNTER — Ambulatory Visit: Admitting: Family

## 2024-07-14 ENCOUNTER — Other Ambulatory Visit: Payer: Self-pay | Admitting: Nurse Practitioner

## 2024-07-14 DIAGNOSIS — F4323 Adjustment disorder with mixed anxiety and depressed mood: Secondary | ICD-10-CM

## 2024-07-19 ENCOUNTER — Other Ambulatory Visit: Payer: Self-pay | Admitting: Nurse Practitioner

## 2024-07-19 DIAGNOSIS — F4323 Adjustment disorder with mixed anxiety and depressed mood: Secondary | ICD-10-CM

## 2024-07-19 NOTE — Telephone Encounter (Signed)
 Copied from CRM [id number]. Topic: Clinical - Medication Refill >> Jul 19, 2024 11:05 AM Suzen RAMAN wrote: Medication: ALPRAZolam  (XANAX ) 0.25 MG tablet busPIRone  (BUSPAR ) 10 MG tablet    Has the patient contacted their pharmacy? Yes   This is the patient's preferred pharmacy:  [id number] [place], [place] - [address] [place] [address] Phone: [phone] Fax: [phone]  Is this the correct pharmacy for this prescription? Yes If no, delete pharmacy and type the correct one.   Has the prescription been filled recently? No  Is the patient out of the medication? Yes  Has the patient been seen for an appointment in the last year OR does the patient have an upcoming appointment? Yes  Can we respond through MyChart? Yes  Agent: Please be advised that Rx refills may take up to 3 business days. We ask that you follow-up with your pharmacy.

## 2024-07-24 MED ORDER — ALPRAZOLAM 0.25 MG PO TABS: 0.2500 mg | ORAL_TABLET | Freq: Two times a day (BID) | ORAL | 0 refills | Status: DC | PRN

## 2024-07-24 MED ORDER — BUSPIRONE HCL 10 MG PO TABS: 10.0000 mg | ORAL_TABLET | Freq: Two times a day (BID) | ORAL | 3 refills | Status: DC

## 2024-10-02 ENCOUNTER — Ambulatory Visit: Admitting: Nurse Practitioner

## 2024-10-02 ENCOUNTER — Encounter: Payer: Self-pay | Admitting: Nurse Practitioner

## 2024-10-02 VITALS — BP 102/68 | HR 52 | Temp 98.2°F | Ht 67.0 in | Wt 161.2 lb

## 2024-10-02 DIAGNOSIS — E538 Deficiency of other specified B group vitamins: Secondary | ICD-10-CM

## 2024-10-02 DIAGNOSIS — F4323 Adjustment disorder with mixed anxiety and depressed mood: Secondary | ICD-10-CM | POA: Diagnosis not present

## 2024-10-02 DIAGNOSIS — M238X1 Other internal derangements of right knee: Secondary | ICD-10-CM | POA: Diagnosis not present

## 2024-10-02 NOTE — Assessment & Plan Note (Signed)
 Mood has improved with current treatment. Anxiety is managed with Buspar  10 mg twice daily, and Xanax  0.25 mg is used rarely for situational anxiety. Continue Buspar  10 mg twice daily and use Xanax  0.25 mg as needed. PDMP reviewed. Encouraged to contact if worsening symptoms, unusual behavior changes or suicidal thoughts occur.

## 2024-10-02 NOTE — Assessment & Plan Note (Signed)
 Managed with supplementation. Continue daily vitamin B12 supplementation.

## 2024-10-02 NOTE — Progress Notes (Signed)
 Leron Glance, NP-C Phone: [phone]  Amy Harvey is a 33 y.o. female who presents today for follow up.   Discussed the use of AI scribe software for clinical note transcription with the patient, who gave verbal consent to proceed.  History of Present Illness   Amy Harvey is a 33 year old female who presents with right knee crepitus and discomfort.  She has been experiencing 'crunchy noises' in her right knee, which began two days ago after returning from a four-day trip to [place] where she was on her feet extensively. The knee feels weak with some discomfort, particularly when extending the knee, but there is no significant pain or swelling. She has not tried any treatments such as Tylenol , ice, or heat.  She is currently taking Buspar  10 mg twice a day for anxiety and has weaned off Zoloft  completely. She was also prescribed Xanax  0.25 mg as needed, which she uses very rarely, most recently during a stressful drive to [place]. Her anxiety is much better and her mood is stable despite ongoing stress from a divorce.  Her sciatica has improved significantly, and she did not attend physical therapy as the symptoms resolved before she was contacted. She is taking vitamin B12 and vitamin D  supplements regularly, although she has not noticed any changes yet. She is also trying to increase her protein intake.  She is currently going through a divorce, which has been ongoing for over a year. Her daughter appears happier, and she is considering returning to therapy now that she has insurance again.      Social History   Tobacco Use  Smoking Status Never  Smokeless Tobacco Never    Current Outpatient Medications on File Prior to Visit  Medication Sig Dispense Refill   ALPRAZolam  (XANAX ) 0.25 MG tablet Take 1 tablet (0.25 mg total) by mouth 2 (two) times daily as needed for anxiety. 30 tablet 0   busPIRone  (BUSPAR ) 10 MG tablet Take 1 tablet (10 mg total) by mouth 2 (two) times  daily. 180 tablet 3   Cholecalciferol (VITAMIN D3) 125 MCG (5000 UT) TABS Take 1 tablet by mouth daily.     levonorgestrel (MIRENA, 52 MG,) 20 MCG/DAY IUD Provided by care center     spironolactone (ALDACTONE) 25 MG tablet Take 1 tablet by mouth daily.     No current facility-administered medications on file prior to visit.     ROS see history of present illness  Objective  Physical Exam Vitals:   10/02/24 0947  BP: 102/68  Pulse: (!) 52  Temp: 98.2 F (36.8 C)  SpO2: 98%    BP Readings from Last 3 Encounters:  10/02/24 102/68  04/02/24 120/76  03/27/24 126/80   Wt Readings from Last 3 Encounters:  10/02/24 161 lb 3.2 oz (73.1 kg)  04/02/24 156 lb 6 oz (70.9 kg)  03/27/24 162 lb (73.5 kg)    Physical Exam Constitutional:      General: She is not in acute distress.    Appearance: Normal appearance.  HENT:     Head: Normocephalic.  Cardiovascular:     Rate and Rhythm: Normal rate and regular rhythm.     Heart sounds: Normal heart sounds.  Pulmonary:     Effort: Pulmonary effort is normal.     Breath sounds: Normal breath sounds.  Musculoskeletal:     Right knee: Crepitus present. No swelling or erythema. Normal range of motion. No tenderness.     Left knee: Normal.  Skin:  General: Skin is warm and dry.  Neurological:     General: No focal deficit present.     Mental Status: She is alert.  Psychiatric:        Mood and Affect: Mood normal.        Behavior: Behavior normal.      Assessment/Plan: Please see individual problem list.  Crepitus of joint of right knee Assessment & Plan: Right knee crepitus with mild discomfort is likely due to increased activity. Denies significant pain and swelling. Not limiting daily activities. Use ice, heat, acetaminophen , or ibuprofen  for discomfort. Recommend x-ray if symptoms worsen or become problematic.   Adjustment reaction with anxiety and depression Assessment & Plan: Mood has improved with current treatment.  Anxiety is managed with Buspar  10 mg twice daily, and Xanax  0.25 mg is used rarely for situational anxiety. Continue Buspar  10 mg twice daily and use Xanax  0.25 mg as needed. PDMP reviewed. Encouraged to contact if worsening symptoms, unusual behavior changes or suicidal thoughts occur.    B12 deficiency Assessment & Plan: Managed with supplementation. Continue daily vitamin B12 supplementation.     Return in about 6 months (around 04/01/2025) for Follow up.   Leron Glance, NP-C [facility]

## 2024-10-02 NOTE — Assessment & Plan Note (Signed)
 Right knee crepitus with mild discomfort is likely due to increased activity. Denies significant pain and swelling. Not limiting daily activities. Use ice, heat, acetaminophen , or ibuprofen  for discomfort. Recommend x-ray if symptoms worsen or become problematic.

## 2024-10-17 ENCOUNTER — Encounter: Payer: Self-pay | Admitting: Dermatology

## 2024-10-17 ENCOUNTER — Ambulatory Visit: Admitting: Dermatology

## 2024-10-17 DIAGNOSIS — D229 Melanocytic nevi, unspecified: Secondary | ICD-10-CM

## 2024-10-17 DIAGNOSIS — D492 Neoplasm of unspecified behavior of bone, soft tissue, and skin: Secondary | ICD-10-CM

## 2024-10-17 DIAGNOSIS — Z86018 Personal history of other benign neoplasm: Secondary | ICD-10-CM

## 2024-10-17 DIAGNOSIS — L814 Other melanin hyperpigmentation: Secondary | ICD-10-CM

## 2024-10-17 NOTE — Progress Notes (Signed)
   Follow-Up Visit   Subjective  Amy Harvey is a 33 y.o. female who presents for the following: 7 month recheck. 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 Left superior buttock below sup waistline, left medial bicep, left lateral epigastric.  The following portions of the chart were reviewed this encounter and updated as appropriate: medications, allergies, medical history  Review of Systems:  No other skin or systemic complaints except as noted in HPI or Assessment and Plan.  Objective  Well appearing patient in no apparent distress; mood and affect are within normal limits.  A focused examination was performed of the following areas: Left superior buttock below sup waistline, left medial bicep, left lateral epigastric  Relevant exam findings are noted in the Assessment and Plan.  Left Shoulder - Posterior/Left posterior deltoid 5 mm irregular brown macule   Assessment & Plan   HISTORY OF DYSPLASTIC NEVI. 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 Left superior buttock below sup waistline, left medial bicep, left lateral epigastric. Bx: 03/29/2024. No evidence of recurrence today Recommend regular full body skin exams Recommend daily broad spectrum sunscreen SPF 30+ to sun-exposed areas, reapply every 2 hours as needed.  Call if any new or changing lesions are noted between office visits  LENTIGINES Exam: scattered tan macules Due to sun exposure Treatment Plan: Benign-appearing, observe. Recommend daily broad spectrum sunscreen SPF 30+ to sun-exposed areas, reapply every 2 hours as needed.  Call for any changes  MELANOCYTIC NEVI Exam: Tan-brown and/or pink-flesh-colored symmetric macules and papules Treatment Plan: Benign appearing on exam today. Recommend observation. Call clinic for new or changing moles. Recommend daily use of broad spectrum spf 30+ sunscreen to sun-exposed areas.   NEOPLASM OF SKIN Left Shoulder - Posterior/Left  posterior deltoid Epidermal / dermal shaving  Lesion diameter (cm):  0.5 Informed consent: discussed and consent obtained   Timeout: patient name, date of birth, surgical site, and procedure verified   Procedure prep:  Patient was prepped and draped in usual sterile fashion Prep type:  Isopropyl alcohol Anesthesia: the lesion was anesthetized in a standard fashion   Anesthetic:  1% lidocaine w/ epinephrine 1-100,000 buffered w/ 8.4% NaHCO3 Instrument used: flexible razor blade   Hemostasis achieved with: pressure, aluminum chloride and electrodesiccation   Outcome: patient tolerated procedure well   Post-procedure details: sterile dressing applied and wound care instructions given   Dressing type: bandage and petrolatum    Specimen 1 - Surgical pathology Differential Diagnosis: R/O dysplastic nevus  Check Margins: Yes HISTORY OF DYSPLASTIC NEVUS   MELANOCYTIC NEVUS, UNSPECIFIED LOCATION    Return for TBSE As Scheduled.  I, Jill Parcell, CMA, am acting as scribe for Alm Rhyme, MD.   Documentation: I have reviewed the above documentation for accuracy and completeness, and I agree with the above.  Alm Rhyme, MD

## 2024-10-17 NOTE — Patient Instructions (Signed)

## 2024-10-18 LAB — SURGICAL PATHOLOGY

## 2024-10-22 ENCOUNTER — Encounter: Payer: Self-pay | Admitting: Dermatology

## 2024-10-22 ENCOUNTER — Ambulatory Visit: Payer: Self-pay | Admitting: Dermatology

## 2024-10-22 NOTE — Telephone Encounter (Addendum)
 Called and discussed bx results with patient. She verbalized understanding and denied further questions. Will recheck at next followup  ----- Message from Alm Rhyme sent at 10/22/2024  5:20 PM EST ----- FINAL DIAGNOSIS        1. Skin, left shoulder - posterior/ left posterior deltoid :       DYSPLASTIC JUNCTIONAL LENTIGINOUS NEVUS WITH MODERATE ATYPIA, LIMITED MARGINS       FREE   Moderate Dysplastic Recheck next visit ----- Message ----- From: Interface, [facility] Sent: 10/18/2024   4:26 PM EST To: Alm JAYSON Rhyme, MD

## 2024-10-23 ENCOUNTER — Encounter: Payer: Self-pay | Admitting: Dermatology

## 2024-12-03 ENCOUNTER — Telehealth: Payer: Self-pay

## 2024-12-03 ENCOUNTER — Encounter: Payer: Self-pay | Admitting: Internal Medicine

## 2024-12-03 ENCOUNTER — Ambulatory Visit: Admitting: Internal Medicine

## 2024-12-03 VITALS — BP 134/82 | HR 75 | Temp 97.9°F | Ht 67.0 in | Wt 176.6 lb

## 2024-12-03 DIAGNOSIS — Z111 Encounter for screening for respiratory tuberculosis: Secondary | ICD-10-CM

## 2024-12-03 DIAGNOSIS — F4323 Adjustment disorder with mixed anxiety and depressed mood: Secondary | ICD-10-CM | POA: Diagnosis not present

## 2024-12-03 DIAGNOSIS — Z021 Encounter for pre-employment examination: Secondary | ICD-10-CM | POA: Insufficient documentation

## 2024-12-03 DIAGNOSIS — M238X1 Other internal derangements of right knee: Secondary | ICD-10-CM

## 2024-12-03 MED ORDER — BUSPIRONE HCL 10 MG PO TABS: 10.0000 mg | ORAL_TABLET | Freq: Two times a day (BID) | ORAL | 3 refills | Status: AC

## 2024-12-03 MED ORDER — ALPRAZOLAM 0.25 MG PO TABS: 0.2500 mg | ORAL_TABLET | Freq: Two times a day (BID) | ORAL | 0 refills | Status: AC | PRN

## 2024-12-03 NOTE — Assessment & Plan Note (Signed)
-   Patient states that her right knee pain has significantly improved from last year and that she wears a knee brace to help with her pain -Patient is able to do her daily activities and is able to lift up her 34-year-old child without difficulty -No limitations on lifting at this time -Will continue with current conservative measures for her knee pain -No further workup at this time

## 2024-12-03 NOTE — Progress Notes (Signed)
 "  Acute Office Visit  Subjective:     Patient ID: Amy Harvey, female    DOB: 1990-11-17, 34 y.o.   MRN: [id number]  Chief Complaint  Patient presents with   Acute Visit    Exam for [employer]    Discussed the use of AI scribe software for clinical note transcription with the patient, who gave verbal consent to proceed.  History of Present Illness Amy Harvey is a 34 year old female who presents for employment-related health clearance for the [employer].  Psychiatric symptoms and medication use -Patient states that symptoms are improved on current doses of medication - Continues to take Buspar  and Xanax  as needed for anxiety - Requests medication refill due to difficulty accessing the [facility] website  Musculoskeletal symptoms - Lower back stiffness and occasional discomfort, improved compared to previous year - History of sciatic nerve issues requiring a cane last year, now resolved - Wears a knee brace for knee discomfort - Knee and back symptoms improved compared to the beginning of last year when she was using a cane  Immunization and infectious disease screening - Last tetanus vaccine since before pregnancy in 2022 - Uncertain TB test history, open to skin testing if necessary  Vision status - Vision stable with corrective lenses - Alternates between glasses and contacts - Recent eye exam a optometrist in [place]  Constitutional and systemic symptoms - No cough, cold, fever, chills, nausea, vomiting, diarrhea, or leg swelling    Review of Systems  Constitutional: Negative.   HENT: Negative.    Respiratory: Negative.    Gastrointestinal: Negative.   Musculoskeletal:  Positive for joint pain.       Complains of chronic right knee pain.  Neurological: Negative.   Psychiatric/Behavioral: Negative.          Objective:    BP 134/82   Pulse 75   Temp 97.9 F (36.6 C)   Ht 5' 7 (1.702 m)   Wt 176 lb 9.6 oz (80.1 kg)   SpO2 97%    BMI 27.66 kg/m    Physical Exam Constitutional:      Appearance: Normal appearance.  HENT:     Head: Normocephalic and atraumatic.  Cardiovascular:     Rate and Rhythm: Normal rate and regular rhythm.     Heart sounds: Normal heart sounds.  Pulmonary:     Effort: Pulmonary effort is normal.     Breath sounds: Normal breath sounds. No wheezing, rhonchi or rales.  Abdominal:     General: Bowel sounds are normal. There is no distension.     Palpations: Abdomen is soft.     Tenderness: There is no abdominal tenderness. There is no guarding or rebound.  Musculoskeletal:        General: No swelling or tenderness.     Right lower leg: No edema.     Left lower leg: No edema.  Neurological:     Mental Status: She is alert.  Psychiatric:        Mood and Affect: Mood normal.        Behavior: Behavior normal.     No results found for any visits on 12/03/24.      Assessment & Plan:   Problem List Items Addressed This Visit       Other   Adjustment reaction with anxiety and depression   - This problem is chronic and stable -Patient is currently on BuSpar  10 mg twice daily as well as Xanax   0.25 mg used as needed for situational anxiety -She needs refills on these medications.  PDMP reviewed -Will refill her Xanax  and BuSpar  today -Patient has appoint with her PCP for follow-up in May -No further workup at this time      Relevant Medications   ALPRAZolam  (XANAX ) 0.25 MG tablet   busPIRone  (BUSPAR ) 10 MG tablet   Crepitus of joint of right knee - Primary   - Patient states that her right knee pain has significantly improved from last year and that she wears a knee brace to help with her pain -Patient is able to do her daily activities and is able to lift up her 21-year-old child without difficulty -No limitations on lifting at this time -Will continue with current conservative measures for her knee pain -No further workup at this time      Physical exam, pre-employment   -  Patient has preemployment paperwork for the [employer], [employer] -She has no limitations on weight lifting.  She had recent eye exam and wears corrective lenses but has no vision difficulties. -She has no hearing difficulties -Pulmonary and cardiac physical exam . -Her last tetanus vaccine was on July 15, 2021.  She remains current on this vaccine -Patient is current on all her other recommended vaccines as well including hepatitis B, hepatitis  A, MMR, Hib, varicella -PPD placed today.  She will follow-up with the nurses visit to ensure that this is negative -No further workup at this time       Meds ordered this encounter  Medications   ALPRAZolam  (XANAX ) 0.25 MG tablet    Sig: Take 1 tablet (0.25 mg total) by mouth 2 (two) times daily as needed for anxiety.    Dispense:  30 tablet    Refill:  0   busPIRone  (BUSPAR ) 10 MG tablet    Sig: Take 1 tablet (10 mg total) by mouth 2 (two) times daily.    Dispense:  180 tablet    Refill:  3    No follow-ups on file.  Verdene Creson, MD   "

## 2024-12-03 NOTE — Telephone Encounter (Signed)
 Sorry first he said you then he just did it.

## 2024-12-03 NOTE — Progress Notes (Signed)
 PPD placed in Patient's left arm.

## 2024-12-03 NOTE — Assessment & Plan Note (Addendum)
-   Patient has preemployment paperwork for the [employer], [employer] -She has no limitations on weight lifting.  She had recent eye exam and wears corrective lenses but has no vision difficulties. -She has no hearing difficulties -Pulmonary and cardiac physical exam . -Her last tetanus vaccine was on July 15, 2021.  She remains current on this vaccine -Patient is current on all her other recommended vaccines as well including hepatitis B, hepatitis  A, MMR, Hib, varicella -PPD placed today.  She will follow-up with the nurses visit to ensure that this is negative -No further workup at this time

## 2024-12-03 NOTE — Patient Instructions (Signed)
" °  VISIT SUMMARY: During your visit, we discussed your employment-related health clearance for the [employer]. We reviewed your psychiatric symptoms, musculoskeletal symptoms, immunization and infectious disease screening, vision status, and general health. We also addressed your medication refill request and completed the necessary occupational health paperwork.  YOUR PLAN: -OCCUPATIONAL HEALTH EVALUATION: You need an occupational health evaluation for your new job. We have ordered a TB skin test, which you will need to follow up on in 48-72 hours. Your tetanus vaccine is up to date, and your recent eye exam shows good vision correction with glasses and contacts. We have completed your occupational health paperwork, pending the TB test results.  -ADJUSTMENT DISORDER WITH MIXED ANXIETY AND DEPRESSED MOOD: This condition involves experiencing both anxiety and depression. Your condition is well-managed with your current medications, Buspar  and Xanax . We have refilled your prescriptions, and you report feeling in a good mood.  -CHRONIC LOW BACK PAIN: Chronic low back pain is ongoing pain in the lower back. Your pain has improved since last year, with no new acute issues. You continue to experience some stiffness and discomfort, but overall, there is improvement.  -CHRONIC BILATERAL KNEE PAIN: Chronic bilateral knee pain is ongoing pain in both knees. Your pain is managed with a knee brace and is mainly exacerbated by carrying weight, such as your child. No new acute issues are reported.  INSTRUCTIONS: Please return for a follow-up nursing visit in 48-72 hours to have your TB skin test read.    Contains text generated by [employer].   "

## 2024-12-03 NOTE — Assessment & Plan Note (Signed)
-   This problem is chronic and stable -Patient is currently on BuSpar  10 mg twice daily as well as Xanax  0.25 mg used as needed for situational anxiety -She needs refills on these medications.  PDMP reviewed -Will refill her Xanax  and BuSpar  today -Patient has appoint with her PCP for follow-up in May -No further workup at this time

## 2024-12-03 NOTE — Telephone Encounter (Addendum)
 Patient is seeing Dr. Narendra and wants to know if you can fill her Alprazolam  & Buspiron. LOV with you was 10/02/24

## 2024-12-05 ENCOUNTER — Ambulatory Visit

## 2024-12-05 NOTE — Progress Notes (Signed)
 PPD Reading Note PPD read and results entered in EpicCare. Result: NEGATIVE mm induration. Interpretation: N/A If test not read within 48-72 hours of initial placement, patient advised to repeat in other arm 1-3 weeks after this test. Allergic reaction: no

## 2025-03-26 ENCOUNTER — Ambulatory Visit: Admitting: Dermatology

## 2025-04-02 ENCOUNTER — Ambulatory Visit: Admitting: Nurse Practitioner

## 2025-04-04 ENCOUNTER — Ambulatory Visit: Admitting: Dermatology
